# Patient Record
Sex: Female | Born: 1975 | Hispanic: No | Smoking: Current every day smoker
Health system: Southern US, Community
[De-identification: ages and names within clinical notes are randomized; demographics above are authoritative.]

## PROBLEM LIST (undated history)

## (undated) DIAGNOSIS — B1920 Unspecified viral hepatitis C without hepatic coma: Secondary | ICD-10-CM

## (undated) DIAGNOSIS — I38 Endocarditis, valve unspecified: Secondary | ICD-10-CM

## (undated) HISTORY — PX: TUBAL LIGATION: SHX77

---

## 1999-08-29 ENCOUNTER — Emergency Department (HOSPITAL_COMMUNITY): Admission: EM | Admit: 1999-08-29 | Discharge: 1999-08-29 | Payer: Self-pay | Admitting: Emergency Medicine

## 1999-09-03 ENCOUNTER — Emergency Department (HOSPITAL_COMMUNITY): Admission: EM | Admit: 1999-09-03 | Discharge: 1999-09-03 | Payer: Self-pay | Admitting: Emergency Medicine

## 2002-02-05 ENCOUNTER — Emergency Department (HOSPITAL_COMMUNITY): Admission: EM | Admit: 2002-02-05 | Discharge: 2002-02-05 | Payer: Self-pay | Admitting: Emergency Medicine

## 2002-02-05 ENCOUNTER — Encounter: Payer: Self-pay | Admitting: Emergency Medicine

## 2002-04-23 ENCOUNTER — Emergency Department (HOSPITAL_COMMUNITY): Admission: EM | Admit: 2002-04-23 | Discharge: 2002-04-23 | Payer: Self-pay | Admitting: Internal Medicine

## 2007-11-27 ENCOUNTER — Emergency Department: Payer: Self-pay | Admitting: Emergency Medicine

## 2008-10-08 ENCOUNTER — Emergency Department: Payer: Self-pay | Admitting: Emergency Medicine

## 2009-01-13 IMAGING — CT CT HEAD WITHOUT CONTRAST
2 series · 16 of 30 positions shown, 20 images · non-contrast
Comparison: None

REASON FOR EXAM: ha
COMMENTS:

PROCEDURE:     CT  - CT HEAD WITHOUT CONTRAST  - November 27, 2007 [DATE]
RESULT:     Noncontrast CT of the brain dated 11/27/2007.
INDICATION: Headache
TECHNIQUE: Multiple axial images from the foramen magnum to the vertex were
obtained without IV contrast.

[Series 2: without · axial · non-contrast · 0.40mm/px · z∈[-77,+43]mm · 13 of 30 slices shown, 17 images]
[im 3/30  brain]
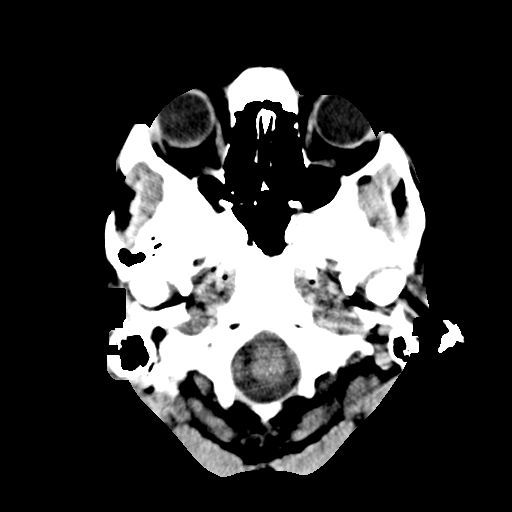
[im 3/30  bone]
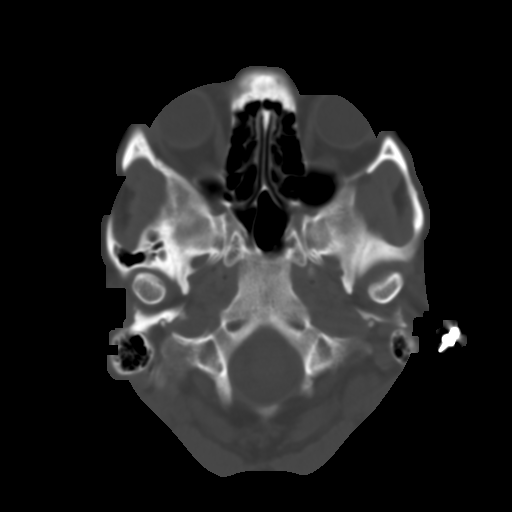
[im 5/30  brain]
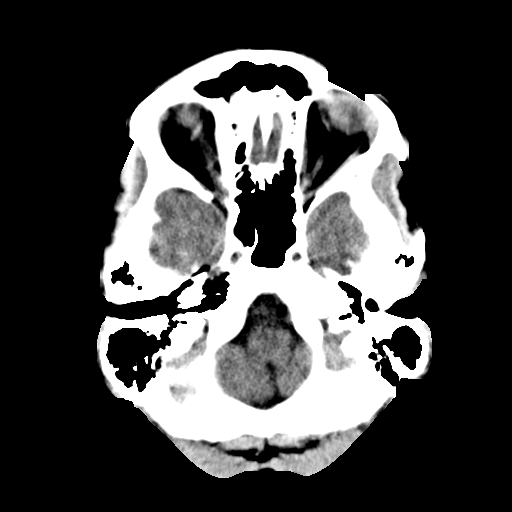
[im 7/30  brain]
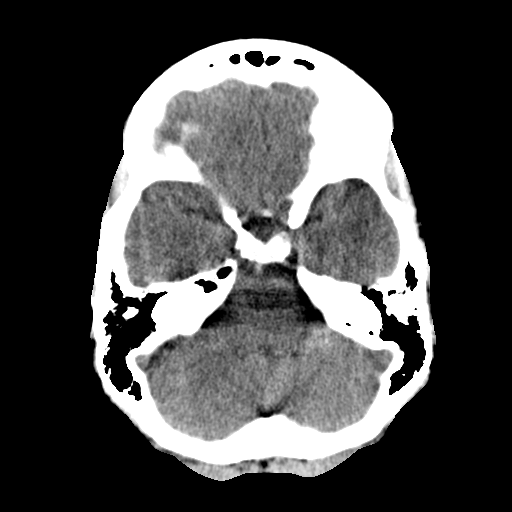
[im 9/30  brain]
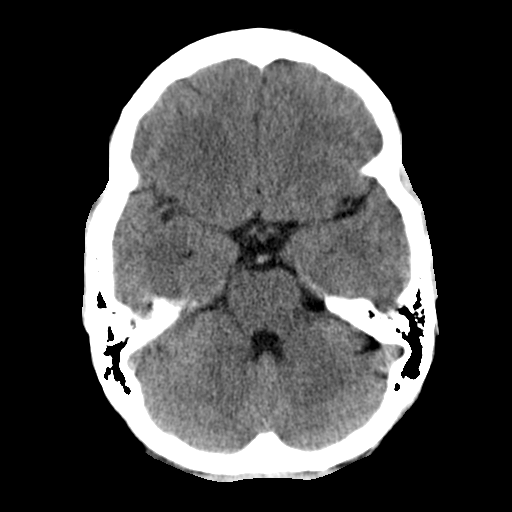
[im 11/30  brain]
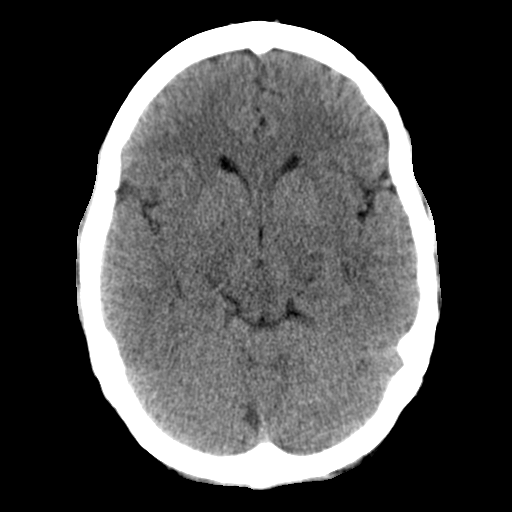
[im 11/30  bone]
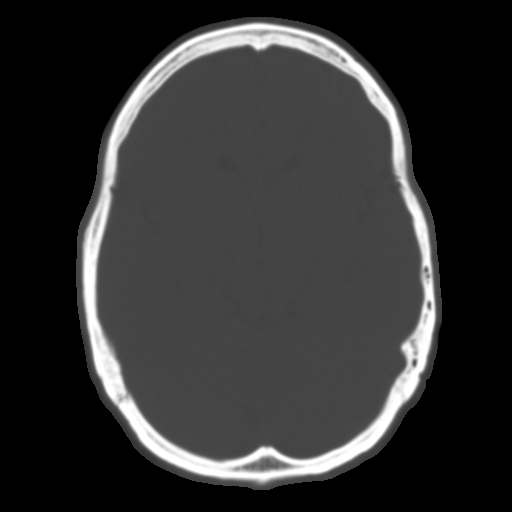
[im 13/30  brain]
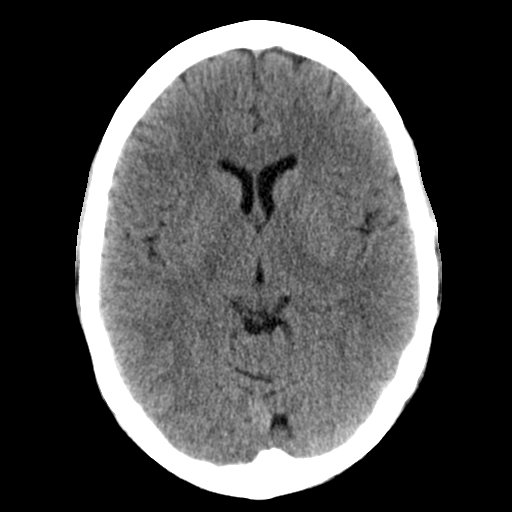
[im 15/30  brain]
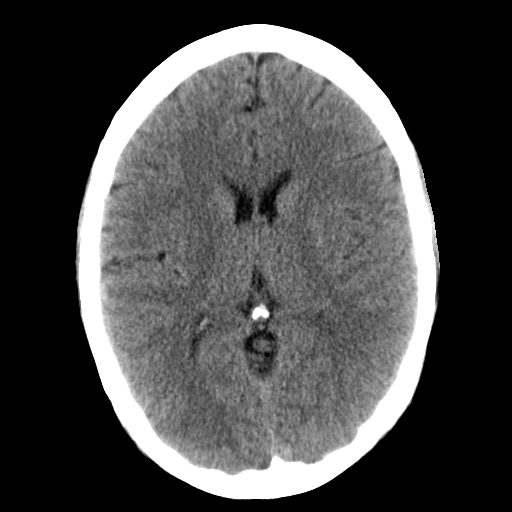
[im 17/30  brain]
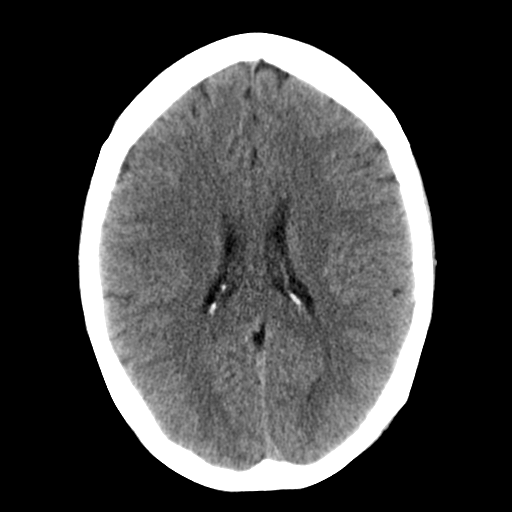
[im 19/30  brain]
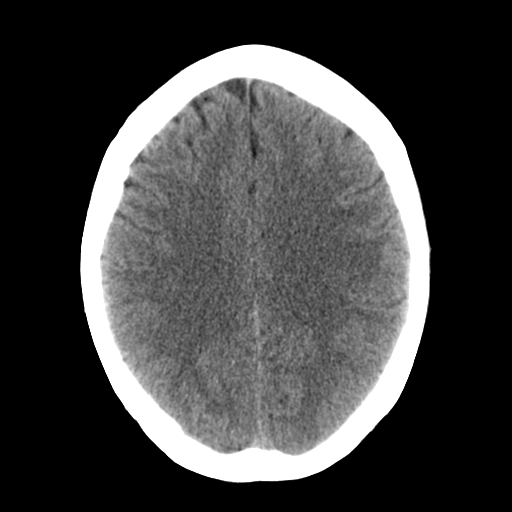
[im 19/30  bone]
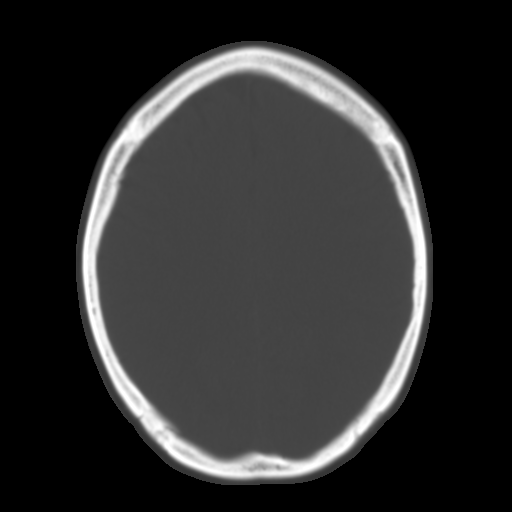
[im 21/30  brain]
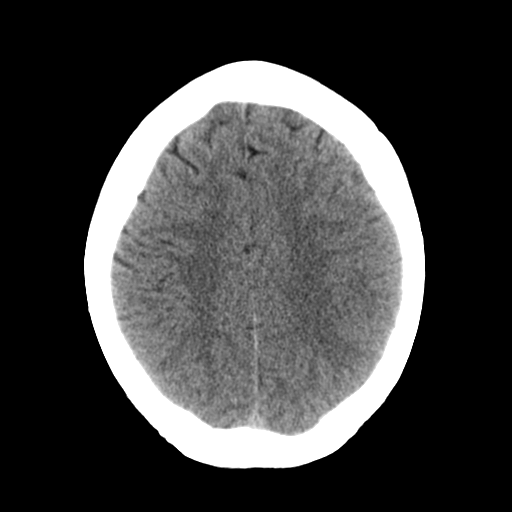
[im 23/30  brain]
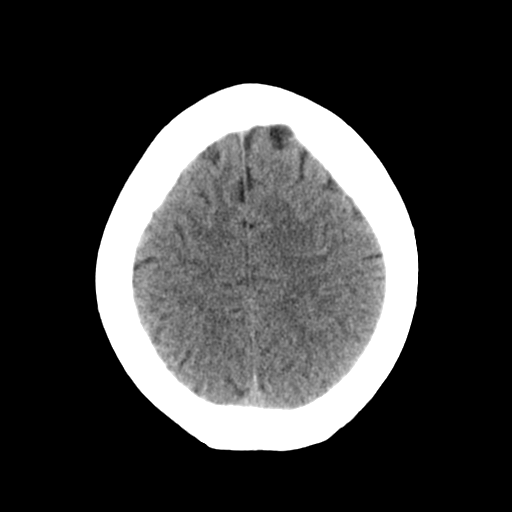
[im 25/30  brain]
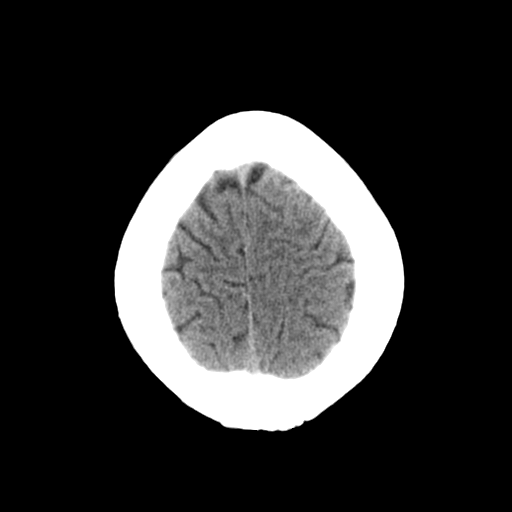
[im 27/30  brain]
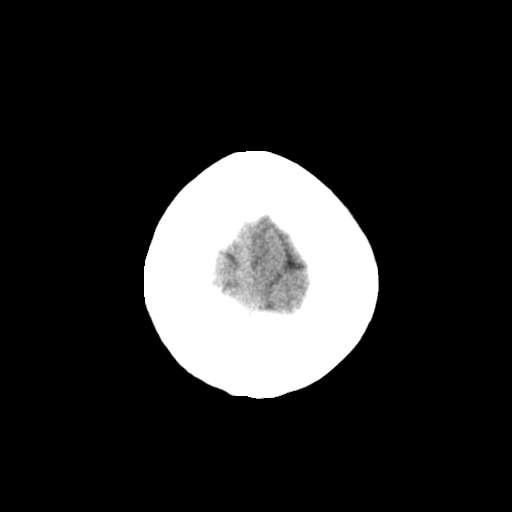
[im 27/30  bone]
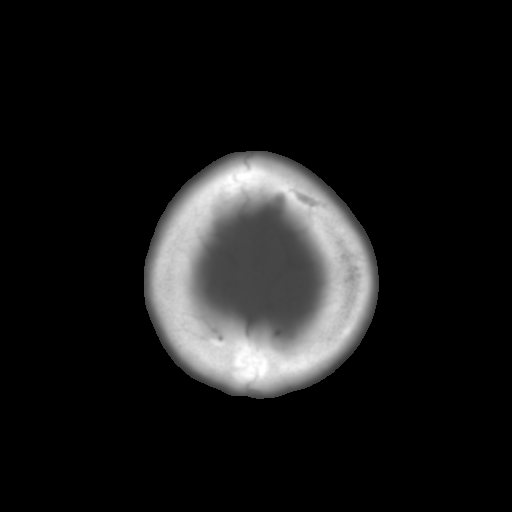

[Series 3: bone · axial · 0.40mm/px · z∈[-77,-37]mm · 3 of 30 slices shown]
[im 3/30  bone]
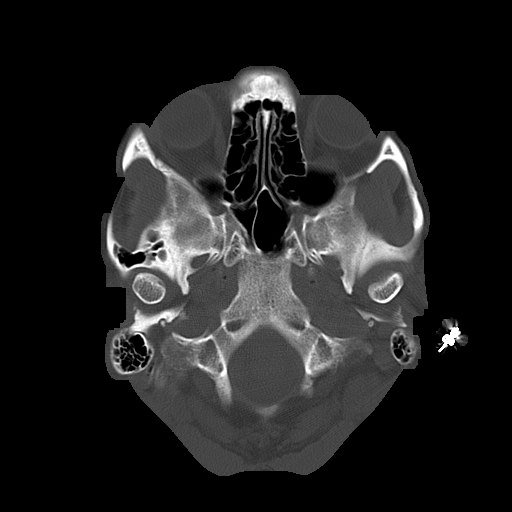
[im 7/30  bone]
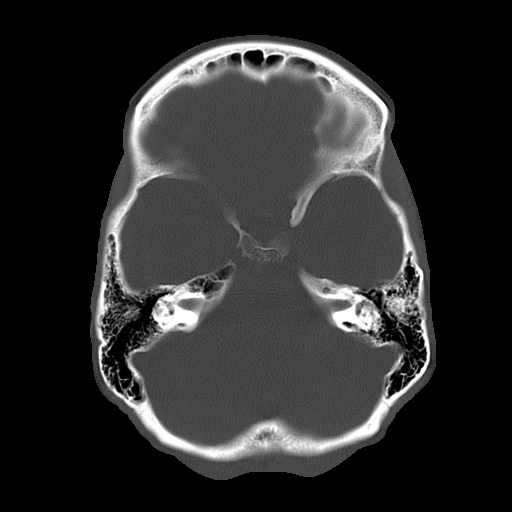
[im 11/30  bone]
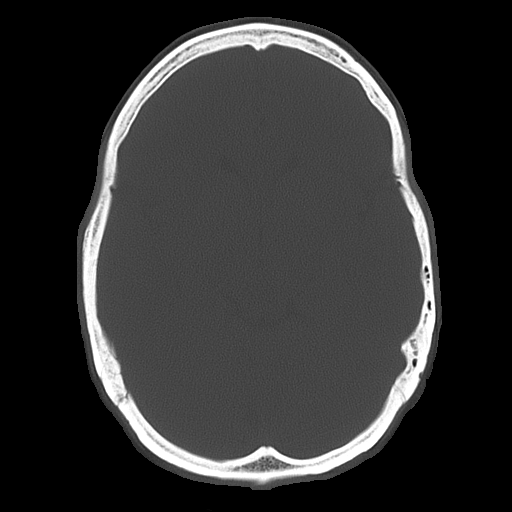

[16 of 30 positions shown; findings below may reference images not displayed]

FINDINGS: There is no evidence for mass effect, midline shift, or extra-axial fluid
collections.  There is no evidence for space-occupying lesion or
intracranial hemorrhage. There is no evidence for cortical-based area of
infarction.

Ventricles and sulci are appropriate for the patient's age. The basal
cisterns are patent.

Visualized portions of the orbits are unremarkable. The paranasal sinuses
and mastoid air cells are unremarkable.

The osseous structures are unremarkable.
IMPRESSION: No acute intracranial process.

## 2009-02-17 ENCOUNTER — Emergency Department: Payer: Self-pay | Admitting: Emergency Medicine

## 2009-09-24 ENCOUNTER — Other Ambulatory Visit: Admission: RE | Admit: 2009-09-24 | Discharge: 2009-09-24 | Payer: Self-pay | Admitting: Obstetrics and Gynecology

## 2009-09-24 ENCOUNTER — Ambulatory Visit: Payer: Self-pay | Admitting: Obstetrics and Gynecology

## 2009-09-24 LAB — CONVERTED CEMR LAB
Eosinophils Absolute: 0.3 10*3/uL (ref 0.0–0.7)
HCT: 36 % (ref 36.0–46.0)
Hemoglobin: 12.3 g/dL (ref 12.0–15.0)
Hepatitis B Surface Ag: NEGATIVE
Lymphs Abs: 3.1 10*3/uL (ref 0.7–4.0)
MCV: 88.2 fL (ref 78.0–100.0)
Monocytes Relative: 8 % (ref 3–12)
Neutrophils Relative %: 71 % (ref 43–77)
RBC: 4.08 M/uL (ref 3.87–5.11)
Rubella: 15 intl units/mL — ABNORMAL HIGH
WBC: 16.7 10*3/uL — ABNORMAL HIGH (ref 4.0–10.5)

## 2009-10-16 ENCOUNTER — Ambulatory Visit (HOSPITAL_COMMUNITY): Admission: RE | Admit: 2009-10-16 | Discharge: 2009-10-16 | Payer: Self-pay | Admitting: Obstetrics & Gynecology

## 2009-10-16 ENCOUNTER — Ambulatory Visit: Payer: Self-pay | Admitting: Obstetrics & Gynecology

## 2009-11-26 ENCOUNTER — Ambulatory Visit: Payer: Self-pay | Admitting: Obstetrics and Gynecology

## 2009-11-26 LAB — CONVERTED CEMR LAB
HCT: 36.2 % (ref 36.0–46.0)
MCV: 92.6 fL (ref 78.0–100.0)
Platelets: 274 10*3/uL (ref 150–400)
RDW: 15.1 % (ref 11.5–15.5)

## 2009-12-03 ENCOUNTER — Ambulatory Visit: Payer: Self-pay | Admitting: Obstetrics and Gynecology

## 2009-12-03 ENCOUNTER — Encounter: Payer: Self-pay | Admitting: Family Medicine

## 2009-12-27 ENCOUNTER — Ambulatory Visit: Payer: Self-pay | Admitting: Obstetrics & Gynecology

## 2009-12-28 ENCOUNTER — Encounter: Payer: Self-pay | Admitting: Obstetrics & Gynecology

## 2009-12-31 ENCOUNTER — Ambulatory Visit (HOSPITAL_COMMUNITY): Admission: RE | Admit: 2009-12-31 | Discharge: 2009-12-31 | Payer: Self-pay | Admitting: Family Medicine

## 2010-01-10 ENCOUNTER — Ambulatory Visit: Payer: Self-pay | Admitting: Obstetrics & Gynecology

## 2010-01-24 ENCOUNTER — Encounter: Payer: Self-pay | Admitting: Family Medicine

## 2010-01-24 ENCOUNTER — Ambulatory Visit: Payer: Self-pay | Admitting: Obstetrics & Gynecology

## 2010-01-24 LAB — CONVERTED CEMR LAB
Chlamydia, DNA Probe: NEGATIVE
GC Probe Amp, Genital: NEGATIVE

## 2010-01-25 ENCOUNTER — Encounter: Payer: Self-pay | Admitting: Family Medicine

## 2010-01-25 LAB — CONVERTED CEMR LAB: Yeast Wet Prep HPF POC: NONE SEEN

## 2010-01-31 ENCOUNTER — Ambulatory Visit: Payer: Self-pay | Admitting: Obstetrics & Gynecology

## 2010-01-31 ENCOUNTER — Inpatient Hospital Stay (HOSPITAL_COMMUNITY): Admission: AD | Admit: 2010-01-31 | Discharge: 2010-01-31 | Payer: Self-pay | Admitting: Obstetrics & Gynecology

## 2010-02-03 ENCOUNTER — Ambulatory Visit: Payer: Self-pay | Admitting: Family Medicine

## 2010-02-03 ENCOUNTER — Inpatient Hospital Stay (HOSPITAL_COMMUNITY): Admission: AD | Admit: 2010-02-03 | Discharge: 2010-02-04 | Payer: Self-pay | Admitting: Obstetrics & Gynecology

## 2010-02-07 ENCOUNTER — Ambulatory Visit: Payer: Self-pay | Admitting: Obstetrics & Gynecology

## 2010-02-09 ENCOUNTER — Inpatient Hospital Stay (HOSPITAL_COMMUNITY): Admission: AD | Admit: 2010-02-09 | Discharge: 2010-02-09 | Payer: Self-pay | Admitting: Obstetrics & Gynecology

## 2010-02-13 ENCOUNTER — Inpatient Hospital Stay (HOSPITAL_COMMUNITY): Admission: AD | Admit: 2010-02-13 | Discharge: 2010-02-13 | Payer: Self-pay | Admitting: Obstetrics and Gynecology

## 2010-02-13 ENCOUNTER — Ambulatory Visit: Payer: Self-pay | Admitting: Obstetrics & Gynecology

## 2010-02-14 ENCOUNTER — Inpatient Hospital Stay (HOSPITAL_COMMUNITY): Admission: AD | Admit: 2010-02-14 | Discharge: 2010-02-16 | Payer: Self-pay | Admitting: Obstetrics and Gynecology

## 2010-02-14 ENCOUNTER — Ambulatory Visit: Payer: Self-pay | Admitting: Obstetrics & Gynecology

## 2010-04-03 ENCOUNTER — Ambulatory Visit: Payer: Self-pay | Admitting: Obstetrics and Gynecology

## 2010-08-01 LAB — CBC
HCT: 38.2 % (ref 36.0–46.0)
MCHC: 34.3 g/dL (ref 30.0–36.0)
RDW: 13.8 % (ref 11.5–15.5)
WBC: 20.1 10*3/uL — ABNORMAL HIGH (ref 4.0–10.5)

## 2010-08-01 LAB — RPR: RPR Ser Ql: NONREACTIVE

## 2010-10-01 NOTE — Assessment & Plan Note (Signed)
Amber Gilmore, Amber Gilmore             ACCOUNT NO.:  [id number]   MEDICAL RECORD NO.:  [id number]          PATIENT TYPE:  POB   LOCATION:  [facility] at [facility]         FACILITY:  [facility]   PHYSICIAN:  Catalina Antigua, MD     DATE OF BIRTH:  14-Nov-1975   DATE OF SERVICE:  04/03/2010                                  CLINIC NOTE   This is a 35 year old, G4, P1-1-2-2, who is status post vaginal delivery  on February 14, 2010, who presents today for postpartum check.  The  patient is currently without any complaints.  Denies any abnormal  bleeding, discharge, or pelvic pain.  The patient has not resumed her  menses nor she has sexual activity yet.  The patient is using postpartum  bilateral tubal ligation for birth control.  The patient denies any  signs and symptoms of postpartum depression, and reports receiving ample  help with the baby.  The patient is currently formula feeding and the  infant thriving well.   PHYSICAL EXAMINATION:  VITAL SIGNS:  Her blood pressure is 128/77, pulse  of 78, weight of 141 pounds, and height of 5 feet 4 inches.  LUNGS:  Clear to auscultation bilaterally.  HEART:  Regular rate and rhythm.  BREASTS:  Nontender.  No engorgement.  No palpable masses or  lymphadenopathy.  No expressible nipple discharge.  ABDOMEN:  Soft, nontender, nondistended.  Her umbilical incision is  perfectly healed.  No erythema, induration, or discharge.  PELVIC:  She had normal-appearing vaginal mucosa and normal-appearing  cervix.  No abnormal bleeding or discharge.  She had a small uterus.  No  palpable adnexal masses or tenderness.   A 2-3 mm skin tag was visualized on the lateral aspect of the left labia  majora, that tag to be removed.  After injection of lidocaine and using  a scalpel, the tag was removed without difficulty.  States that this was  achieved by applying pressure and silver nitrate.  The patient tolerated  the procedure well.  The patient will be contacted with any  abnormal  results.  The patient is due to return in May for full physical exam.  In the meantime, the patient is medically cleared to resume all  activities of daily living without any restriction.           ______________________________  Catalina Antigua, MD     PC/MEDQ  D:  04/03/2010  T:  04/03/2010  Job:  [id number]

## 2010-12-03 IMAGING — US US OB COMP +14 WK
1 series · 14 of 28 positions shown · non-contrast
Comparison: none

OBSTETRICAL ULTRASOUND:
 This ultrasound exam was performed in the [HOSPITAL] Ultrasound Department.  The OB US report was generated in the AS system, and faxed to the ordering physician.  This report is also available in [HOSPITAL]?s [employer] and in [REDACTED] PACS.

[Series 1: us ob comp +14 wk · 0.08mm/px · 14 of 60 slices shown]
[im 3/60]
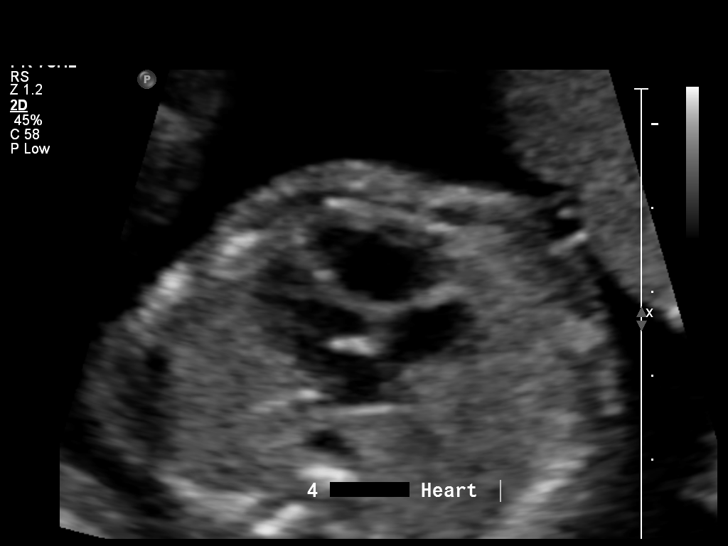
[im 7/60]
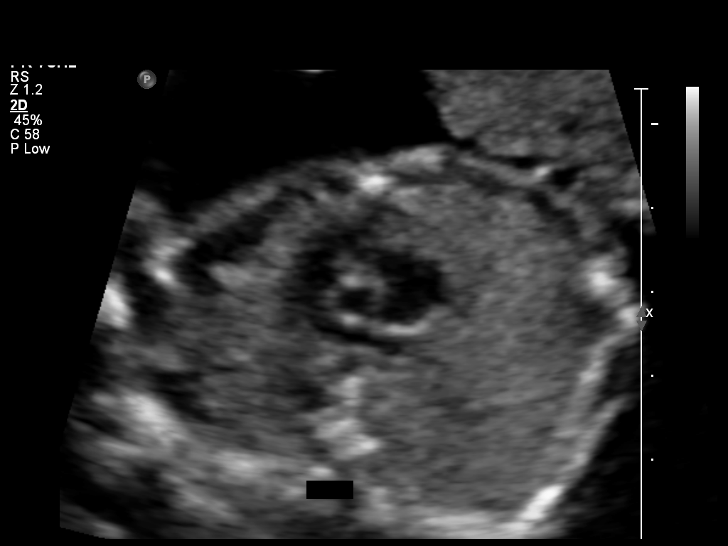
[im 11/60]
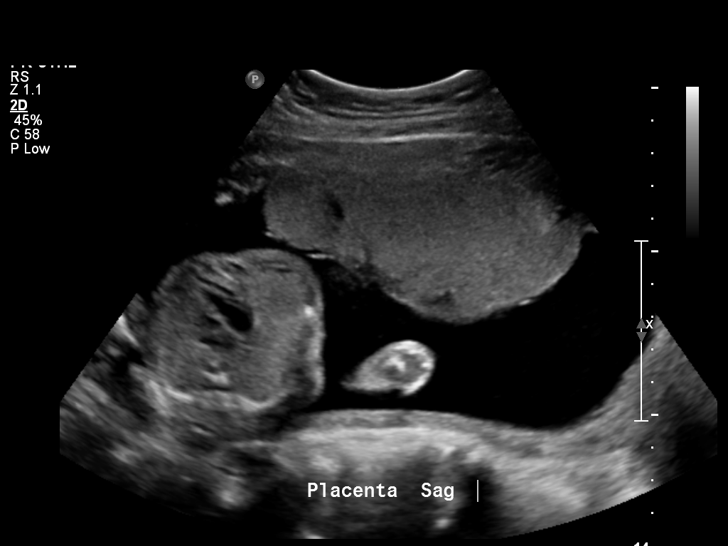
[im 16/60]
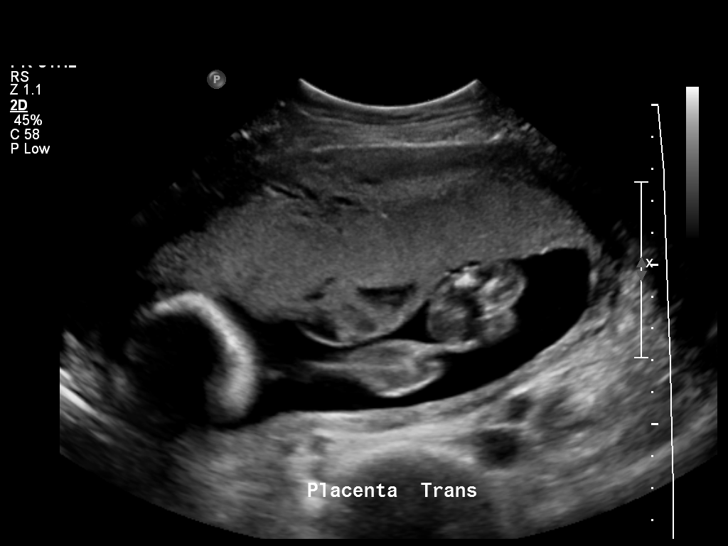
[im 20/60]
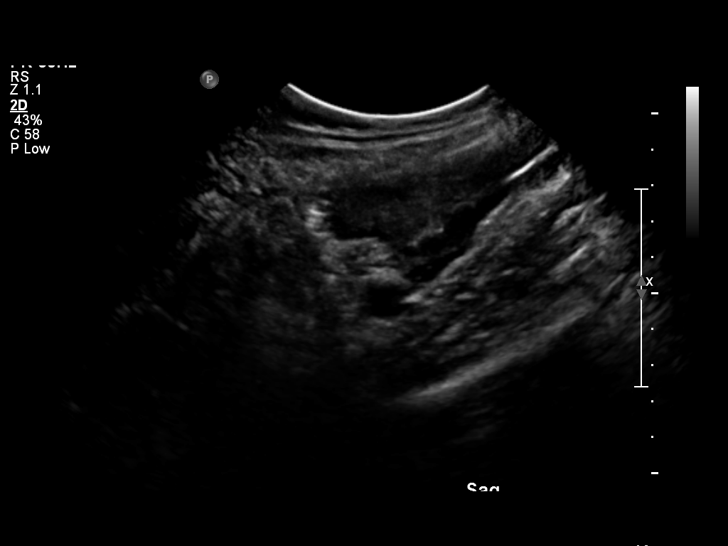
[im 25/60]
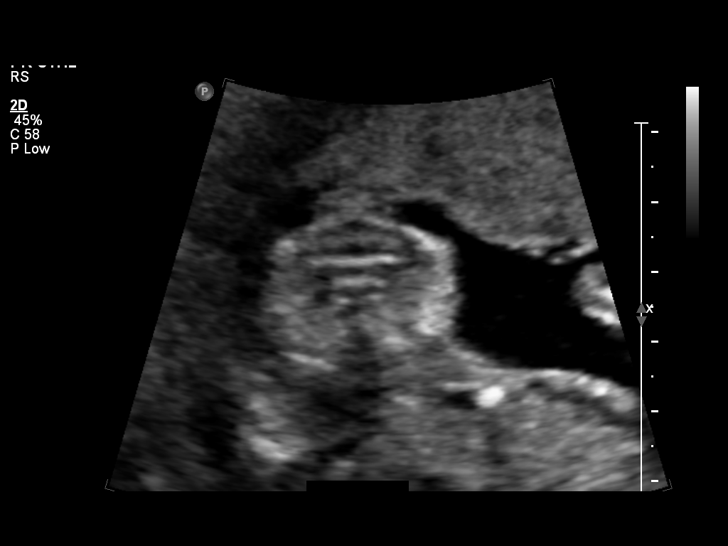
[im 29/60]
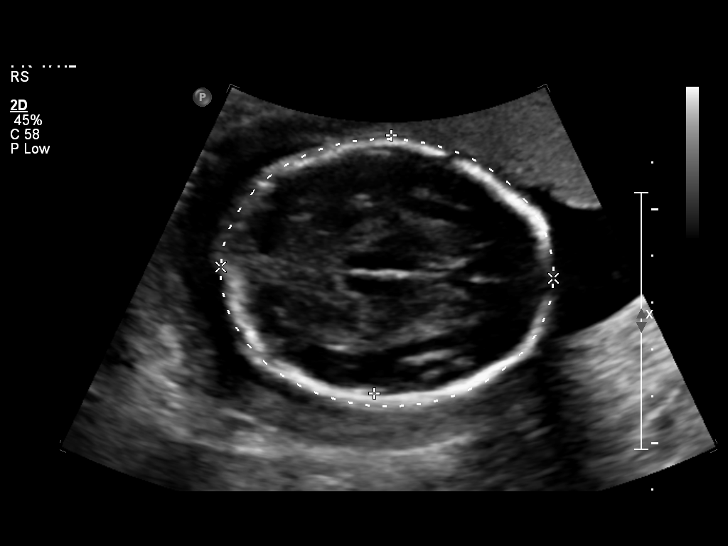
[im 33/60]
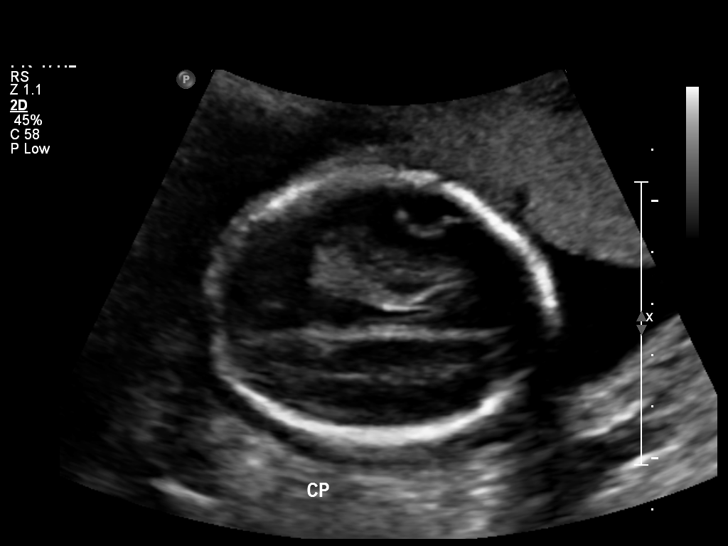
[im 38/60]
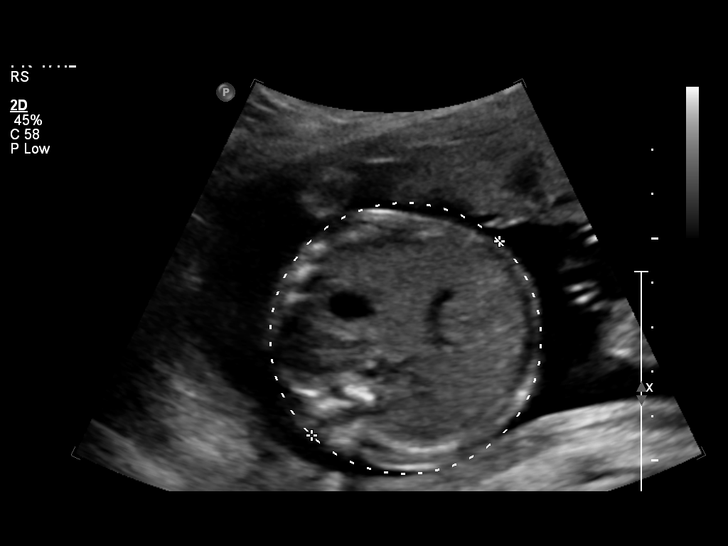
[im 42/60]
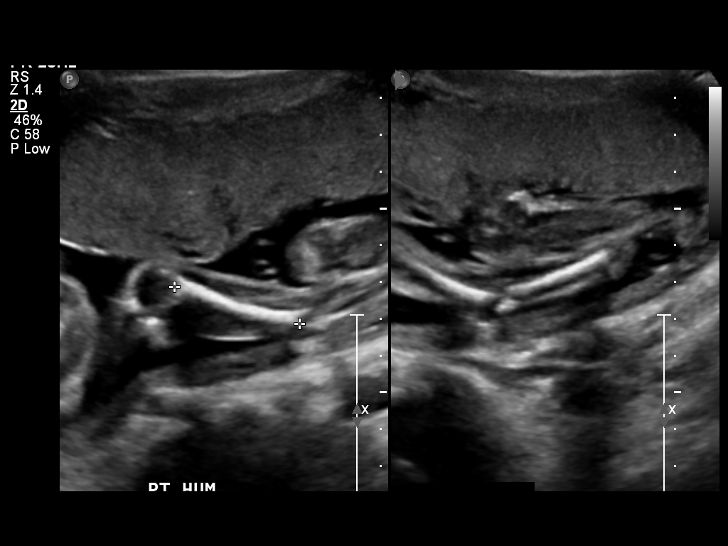
[im 46/60]
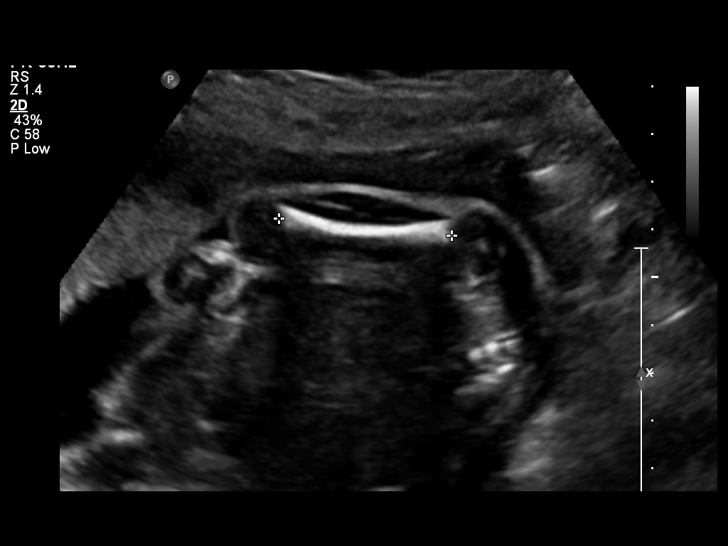
[im 51/60]
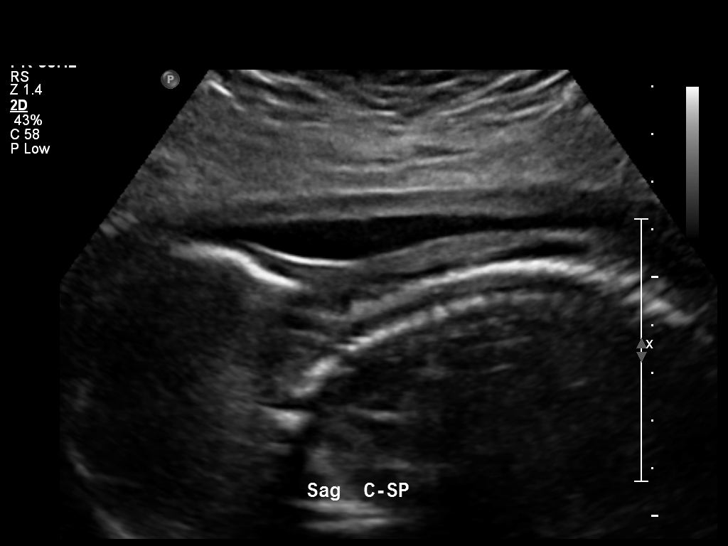
[im 55/60]
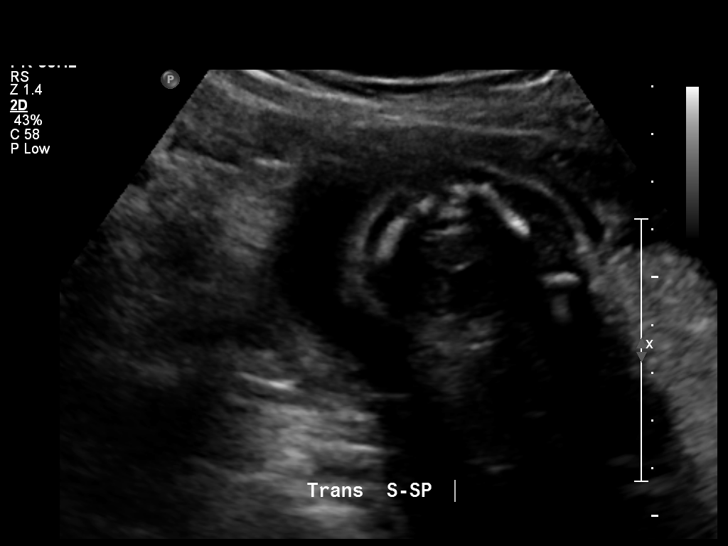
[im 60/60]
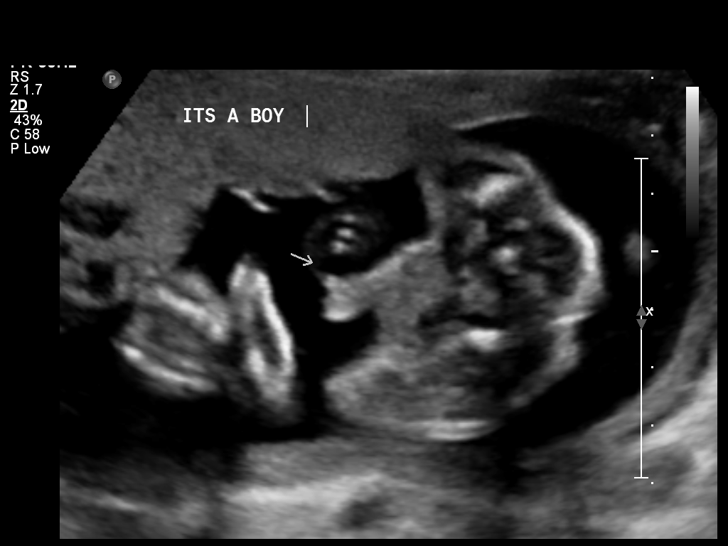

[14 of 28 positions shown; findings below may reference images not displayed]

IMPRESSION: See AS Obstetric US report.

## 2013-06-26 ENCOUNTER — Emergency Department: Payer: Self-pay | Admitting: Emergency Medicine

## 2014-07-01 ENCOUNTER — Emergency Department: Payer: Self-pay | Admitting: Emergency Medicine

## 2015-04-30 ENCOUNTER — Encounter: Payer: Self-pay | Admitting: *Deleted

## 2015-04-30 ENCOUNTER — Emergency Department
Admission: EM | Admit: 2015-04-30 | Discharge: 2015-04-30 | Disposition: A | Discharge status: Home / Self Care | Payer: Self-pay | Attending: Emergency Medicine | Admitting: Emergency Medicine

## 2015-04-30 DIAGNOSIS — Y998 Other external cause status: Secondary | ICD-10-CM | POA: Insufficient documentation

## 2015-04-30 DIAGNOSIS — F1910 Other psychoactive substance abuse, uncomplicated: Secondary | ICD-10-CM

## 2015-04-30 DIAGNOSIS — Z79899 Other long term (current) drug therapy: Secondary | ICD-10-CM | POA: Insufficient documentation

## 2015-04-30 DIAGNOSIS — T507X1A Poisoning by analeptics and opioid receptor antagonists, accidental (unintentional), initial encounter: Secondary | ICD-10-CM | POA: Insufficient documentation

## 2015-04-30 DIAGNOSIS — F1110 Opioid abuse, uncomplicated: Secondary | ICD-10-CM | POA: Insufficient documentation

## 2015-04-30 DIAGNOSIS — Y92002 Bathroom of unspecified non-institutional (private) residence single-family (private) house as the place of occurrence of the external cause: Secondary | ICD-10-CM | POA: Insufficient documentation

## 2015-04-30 DIAGNOSIS — T40601A Poisoning by unspecified narcotics, accidental (unintentional), initial encounter: Secondary | ICD-10-CM

## 2015-04-30 DIAGNOSIS — Y9389 Activity, other specified: Secondary | ICD-10-CM | POA: Insufficient documentation

## 2015-04-30 DIAGNOSIS — F17200 Nicotine dependence, unspecified, uncomplicated: Secondary | ICD-10-CM | POA: Insufficient documentation

## 2015-04-30 NOTE — Discharge Instructions (Signed)
You have been clean for over 3 weeks. Continue with your sobriety. Seek support where you can, including your support meetings. [facility] a counselor or [facility]. Do not use any substances. Return to the emergency department if you have any further urgent concerns  Narcotic Overdose A narcotic overdose is the misuse or overuse of a narcotic drug. A narcotic overdose can make you pass out and stop breathing. If you are not treated right away, this can cause permanent brain damage or stop your heart. Medicine may be given to reverse the effects of an overdose. If so, this medicine may bring on withdrawal symptoms. The symptoms may be abdominal cramps, throwing up (vomiting), sweating, chills, and nervousness. Injecting narcotics can cause more problems than just an overdose. AIDS, hepatitis, and other very serious infections are transmitted by sharing needles and syringes. If you decide to quit using, there are medicines which can help you through the withdrawal period. Trying to quit all at once on your own can be uncomfortable, but not life-threatening. Call your caregiver, Narcotics Anonymous, or any drug and alcohol treatment program for further help.    This information is not intended to replace advice given to you by your health care provider. Make sure you discuss any questions you have with your health care provider.   Document Released: 06/12/2004 Document Revised: 05/26/2014 Document Reviewed: 10/25/2014 Elsevier Interactive Patient Education [employer]2016 [employer]vier Inc.

## 2015-04-30 NOTE — ED Notes (Signed)
Got out of rehab 3 weeks ago, used heroin today became unresponsive, person with her took her to the firestation and received narcan with good results, pt became more alert

## 2015-04-30 NOTE — ED Provider Notes (Signed)
[facility] Emergency Department Provider Note  ____________________________________________  Time seen: 1807  I have reviewed the triage vital signs and the nursing notes.  History by:  Patient  HISTORY  Chief Complaint Drug Problem  heroin overdose    HPI Amber Gilmore is a 39 y.o. female who reports just went 26 days clean, having spent approximate 3 weeks and rehabilitation. She reports that she obtain heroin earlier today. A friend was coming over to go out for dinner. She reports she heard the car pulling up and decided to use to heroin. She went to the bathroom to inject. She remembers coming out of the bathroom and her friend realizing that she had just used heroin started arguing with her. She remembers walking to his car but nothing further. He drove her to a fire station where EMS became involved and she received 1 mg of Narcan and became alert again.   The patient remains alert in the emergency department. She reports she also uses some crystal meth this morning. She reports she has been going to her support meetings but has missed a few recently. She denies any thoughts of self-harm. She appears to be remorseful and tearful about what occurred.  She lives at home with her mother.   History reviewed. No pertinent past medical history.  There are no active problems to display for this patient.   History reviewed. No pertinent past surgical history.  Current Outpatient Rx  Name  Route  Sig  Dispense  Refill  . citalopram (CELEXA) 10 MG tablet   Oral   Take 10 mg by mouth daily.           Allergies Review of patient's allergies indicates no known allergies.  No family history on file.  Social History Social History  Substance Use Topics  . Smoking status: Current Every Day Smoker  . Smokeless tobacco: None  . Alcohol Use: No    Review of Systems  Constitutional: Negative for fever/chills. ENT: Negative for  congestion. Cardiovascular: Negative for chest pain. Respiratory: Negative for cough. Gastrointestinal: Negative for abdominal pain, vomiting and diarrhea. Genitourinary: Negative for dysuria. Musculoskeletal: No back pain. Skin: Multiple small scabbed over areas on her forearms and hands and a few on her face. She reports she picks at her skin. This is a long-term problem.. Neurological: Negative for headache or focal weakness Psychiatric: Ongoing substance abuse problem. Patient denies suicidal ideation. See history of present illness  10-point ROS otherwise negative.  ____________________________________________   PHYSICAL EXAM:  VITAL SIGNS: ED Triage Vitals  Enc Vitals Group     BP 04/30/15 1749 86/46 mmHg     Pulse Rate 04/30/15 1749 81     Resp 04/30/15 1749 20     Temp 04/30/15 1749 97.7 F (36.5 C)     Temp src --      SpO2 04/30/15 1749 95 %     Weight 04/30/15 1749 140 lb (63.504 kg)     Height 04/30/15 1749  (1.676 m)     Head Cir --      Peak Flow --      Pain Score --      Pain Loc --      Pain Edu? --      Excl. in GC? --     Constitutional:  Alert and oriented. Communicative with intact thought process. ENT   Head: Normocephalic and atraumatic.   Nose: No congestion/rhinnorhea.       Mouth:  No erythema, no swelling   Cardiovascular: Normal rate, regular rhythm, no murmur noted Respiratory:  Normal respiratory effort, no tachypnea.    Breath sounds are clear and equal bilaterally.  Gastrointestinal: Soft, no distention. Nontender Back: No muscle spasm, no tenderness, no CVA tenderness. Musculoskeletal: No deformity noted. Nontender with normal range of motion in all extremities.  No noted edema. Neurologic:  Communicative. Normal appearing spontaneous movement in all 4 extremities. No gross focal neurologic deficits are appreciated.  Skin:  Skin is warm, dry. Patient has multiple scabbed over, excoriated, areas on her hands, not as many on  her forearms, and a few on her face. She reports she picks at her skin. I can see her doing this while interviewing her. Psychiatric: Alert and oriented 3. Intact thought process. Remorseful and somewhat tearful, but appropriate and communicative. Denies any suicidal ideation. Appears to be forthright about her substance abuse issues and her challenges.  ____________________________________________    LABS (pertinent positives/negatives)    ____________________________________________   EKG  ED ECG REPORT I, Jasiah Elsen W, the attending physician, personally viewed and interpreted this ECG.   Date: 04/30/2015  EKG Time: 1746  Rate: 85  Rhythm:  Normal sinus rhythm  Axis: Normal  Intervals: Normal  ST&T Change: None noted   ____________________________________________    RADIOLOGY    ____________________________________________   PROCEDURES    ____________________________________________   INITIAL IMPRESSION / ASSESSMENT AND PLAN / ED COURSE  Pertinent labs & imaging results that were available during my care of the patient were reviewed by me and considered in my medical decision making (see chart for details).  39 year old female with a long-term substance abuse problem, recently out of rehabilitation, with a relapse today with an overdose of heroin. She became responsive and alert with 1 mg of Narcan. She remains so here in the emergency department. She denies suicidal ideation. She declines an offer to seek further residential treatment for her problems. She reports she knows what she needs to do, including going to her support meetings. She seems to be remorseful about the event and motivated to stay clean. I do not see any indication for involuntary commitment.  The patient agrees to stay in the emergency department to allow observation and be sure that she does not become unresponsive again when the Narcan wears off area my suspicion for this is low. We will  observe her for 2 hours and discharge her home, unless she has a change of heart and would like to pursue further drug treatment at this time.  ----------------------------------------- 7:45 PM on 04/30/2015 -----------------------------------------  Patient appears alert and communicative. She has not had any resumed narcosis or somnolence. She again reports that she feels safe and declines an offer for any further help with her substance abuse problem. She reports she knows what to do and will follow-up with her support groups. She lives with her mother. We will discharge her at this time.  ____________________________________________   FINAL CLINICAL IMPRESSION(S) / ED DIAGNOSES  Final diagnoses:  Substance abuse  Narcotic overdose, accidental or unintentional, initial encounter      Darien Ramusavid W Dean Wonder, MD 04/30/15 1946

## 2015-07-14 ENCOUNTER — Encounter: Payer: Self-pay | Admitting: Emergency Medicine

## 2015-07-14 ENCOUNTER — Emergency Department
Admission: EM | Admit: 2015-07-14 | Discharge: 2015-07-14 | Disposition: A | Discharge status: Home / Self Care | Payer: Self-pay | Attending: Emergency Medicine | Admitting: Emergency Medicine

## 2015-07-14 DIAGNOSIS — J020 Streptococcal pharyngitis: Secondary | ICD-10-CM | POA: Insufficient documentation

## 2015-07-14 DIAGNOSIS — F17200 Nicotine dependence, unspecified, uncomplicated: Secondary | ICD-10-CM | POA: Insufficient documentation

## 2015-07-14 DIAGNOSIS — Z79899 Other long term (current) drug therapy: Secondary | ICD-10-CM | POA: Insufficient documentation

## 2015-07-14 MED ORDER — AMOXICILLIN 500 MG PO TABS: 500.0000 mg | ORAL_TABLET | Freq: Three times a day (TID) | ORAL | Status: AC

## 2015-07-14 MED ORDER — LIDOCAINE VISCOUS 2 % MT SOLN: 20.0000 mL | OROMUCOSAL | Status: AC | PRN

## 2015-07-14 NOTE — Discharge Instructions (Signed)

## 2015-07-14 NOTE — ED Notes (Signed)
Pt to ed with c/o cough, sore throat, fever, body aches, since last night.  Pt in no acute resp distress, no cough noted during triage.  Pt also c/o headache. Skin warm and dry.

## 2015-07-14 NOTE — ED Notes (Signed)
C/o sore throat, no resp distress

## 2015-07-14 NOTE — ED Provider Notes (Signed)
[facility] Emergency Department Provider Note  ____________________________________________  Time seen: Approximately 11:40 AM  I have reviewed the triage vital signs and the nursing notes.   HISTORY  Chief Complaint Sore Throat   HPI Amber Gilmore is a 40 y.o. female who presents to the emergency department for evaluation of sore throat and headache. Headache started on Friday, throat became sore yesterday and she has noticed white patches on her tonsils. She has been taking ibuprofen without relief.  History reviewed. No pertinent past medical history.  There are no active problems to display for this patient.   History reviewed. No pertinent past surgical history.  Current Outpatient Rx  Name  Route  Sig  Dispense  Refill  . amoxicillin (AMOXIL) 500 MG tablet   Oral   Take 1 tablet (500 mg total) by mouth 3 (three) times daily.   30 tablet   0   . citalopram (CELEXA) 10 MG tablet   Oral   Take 10 mg by mouth daily.         Marland Kitchen lidocaine (XYLOCAINE) 2 % solution   Mouth/Throat   Use as directed 20 mLs in the mouth or throat as needed (as needed for sore throat).   100 mL   0     Allergies Review of patient's allergies indicates no known allergies.  History reviewed. No pertinent family history.  Social History Social History  Substance Use Topics  . Smoking status: Current Every Day Smoker  . Smokeless tobacco: None  . Alcohol Use: No    Review of Systems Constitutional: Positive for fever. ENT: Positive for sore throat; negative for difficulty swallowing. Respiratory: Denies shortness of breath. Gastrointestinal: No abdominal pain.  No nausea, no vomiting.  No diarrhea.  Musculoskeletal: Positive for generalized body aches. Skin: Negative for rash. Neurological: Positive for headaches, negative for focal weakness or numbness.  ____________________________________________   PHYSICAL EXAM:  VITAL SIGNS: ED Triage  Vitals  Enc Vitals Group     BP 07/14/15 1124 112/86 mmHg     Pulse Rate 07/14/15 1124 98     Resp 07/14/15 1124 20     Temp 07/14/15 1124 99 F (37.2 C)     Temp Source 07/14/15 1124 Oral     SpO2 07/14/15 1124 98 %     Weight 07/14/15 1124 135 lb (61.236 kg)     Height 07/14/15 1124  (1.676 m)     Head Cir --      Peak Flow --      Pain Score 07/14/15 1121 8     Pain Loc --      Pain Edu? --      Excl. in GC? --     Constitutional: Alert and oriented. Well appearing and in no acute distress. Eyes: Conjunctivae are normal. PERRL. EOMI. Head: Atraumatic. Nose: No congestion/rhinnorhea. Mouth/Throat: Mucous membranes are moist.  Oropharynx erythematous, with tonsillar edema and  exudate. Uvula midline. Upper airway patent without edema. Neck: No stridor.  Lymphatic: Lymphadenopathy: absent  Cardiovascular: Normal rate, regular rhythm. Good peripheral circulation. Respiratory: Normal respiratory effort. Lungs CTAB. Gastrointestinal: Soft and nontender. Musculoskeletal: No lower extremity tenderness nor edema.   Neurologic:  Normal speech and language. No gross focal neurologic deficits are appreciated. Speech is normal. No gait instability. Skin:  Skin is warm, dry and intact. No rash noted Psychiatric: Mood and affect are normal. Speech and behavior are normal.  ____________________________________________   LABS (all labs ordered are listed, but  only abnormal results are displayed)  Labs Reviewed - No data to display ____________________________________________  EKG   ____________________________________________  RADIOLOGY   ____________________________________________   PROCEDURES  Procedure(s) performed: None  Critical Care performed: No  ____________________________________________   INITIAL IMPRESSION / ASSESSMENT AND PLAN / ED COURSE  Pertinent labs & imaging results that were available during my care of the patient were reviewed by me and  considered in my medical decision making (see chart for details).  Diagnosis based on clinical findings. Will treat with amoxicillin x 10 days and give viscous lidocaine for pain. She was advised to take ibuprofen for headache and/or fever. She is to follow up with the PCP of her choice or return to the ER for symptoms that change or worsen if unable to schedule an appointment. ____________________________________________   FINAL CLINICAL IMPRESSION(S) / ED DIAGNOSES  Final diagnoses:  Strep pharyngitis      Chinita Pester, FNP 07/14/15 1647  Governor Rooks, MD 07/15/15 [phone]

## 2015-07-17 ENCOUNTER — Encounter: Payer: Self-pay | Admitting: Medical Oncology

## 2015-07-17 ENCOUNTER — Emergency Department
Admission: EM | Admit: 2015-07-17 | Discharge: 2015-07-17 | Disposition: A | Discharge status: Home / Self Care | Payer: Self-pay | Attending: Emergency Medicine | Admitting: Emergency Medicine

## 2015-07-17 DIAGNOSIS — F17200 Nicotine dependence, unspecified, uncomplicated: Secondary | ICD-10-CM | POA: Insufficient documentation

## 2015-07-17 DIAGNOSIS — Z79899 Other long term (current) drug therapy: Secondary | ICD-10-CM | POA: Insufficient documentation

## 2015-07-17 DIAGNOSIS — J020 Streptococcal pharyngitis: Secondary | ICD-10-CM | POA: Insufficient documentation

## 2015-07-17 DIAGNOSIS — Z792 Long term (current) use of antibiotics: Secondary | ICD-10-CM | POA: Insufficient documentation

## 2015-07-17 MED ORDER — MAGIC MOUTHWASH W/LIDOCAINE: 5.0000 mL | Freq: Four times a day (QID) | ORAL | Status: AC

## 2015-07-17 MED ORDER — CLINDAMYCIN HCL 300 MG PO CAPS: 300.0000 mg | ORAL_CAPSULE | Freq: Four times a day (QID) | ORAL | Status: AC

## 2015-07-17 NOTE — ED Provider Notes (Signed)
[facility] Emergency Department Provider Note  ____________________________________________  Time seen: Approximately 1:38 PM  I have reviewed the triage vital signs and the nursing notes.   HISTORY  Chief Complaint Sore Throat and Fever    HPI Amber Gilmore is a 40 y.o. female who presents emergency department for continued complaint of sore throat. Patient was seen on 07/14/2015 in this department and diagnosed with strep and placed on antibiotics and viscous lidocaine. Patient states that the sore throat has increased since original diagnosis. Patient states that she has been taking antibiotics as prescribed. Patient does smoke and has been continuing to smoke while on antibiotics.Patient denies any difficulty breathing or swallowing, fevers or chills, chest pain, shortness breath, nausea vomiting, abdominal pain.   History reviewed. No pertinent past medical history.  There are no active problems to display for this patient.   History reviewed. No pertinent past surgical history.  Current Outpatient Rx  Name  Route  Sig  Dispense  Refill  . amoxicillin (AMOXIL) 500 MG tablet   Oral   Take 1 tablet (500 mg total) by mouth 3 (three) times daily.   30 tablet   0   . citalopram (CELEXA) 10 MG tablet   Oral   Take 10 mg by mouth daily.         . clindamycin (CLEOCIN) 300 MG capsule   Oral   Take 1 capsule (300 mg total) by mouth 4 (four) times daily.   28 capsule   0   . lidocaine (XYLOCAINE) 2 % solution   Mouth/Throat   Use as directed 20 mLs in the mouth or throat as needed (as needed for sore throat).   100 mL   0   . magic mouthwash w/lidocaine SOLN   Oral   Take 5 mLs by mouth 4 (four) times daily.   240 mL   0     Dispense in a 1/1/1/1 ratio. Use lidocaine, diphen ...     Allergies Review of patient's allergies indicates no known allergies.  No family history on file.  Social History Social History  Substance Use  Topics  . Smoking status: Current Every Day Smoker  . Smokeless tobacco: None  . Alcohol Use: No     Review of Systems  Constitutional: No fever/chills ENT: Positive for sore throat. Denies nasal congestion. Denies ear pain. Cardiovascular: no chest pain. Respiratory: no cough. No SOB. Gastrointestinal: No abdominal pain.  No nausea, no vomiting.   Skin: Negative for rash. Neurological: Negative for headaches, focal weakness or numbness. 10-point ROS otherwise negative.  ____________________________________________   PHYSICAL EXAM:  VITAL SIGNS: ED Triage Vitals  Enc Vitals Group     BP 07/17/15 1158 113/86 mmHg     Pulse Rate 07/17/15 1158 87     Resp 07/17/15 1158 18     Temp 07/17/15 1158 98.4 F (36.9 C)     Temp Source 07/17/15 1158 Oral     SpO2 07/17/15 1158 99 %     Weight 07/17/15 1158 135 lb (61.236 kg)     Height --      Head Cir --      Peak Flow --      Pain Score 07/17/15 1159 7     Pain Loc --      Pain Edu? --      Excl. in GC? --      Constitutional: Alert and oriented. Well appearing and in no acute distress. Eyes: Conjunctivae are  normal. PERRL. EOMI. Head: Atraumatic. ENT:      Ears:       Nose: No congestion/rhinnorhea.      Mouth/Throat: Mucous membranes are moist. Oropharynx is very erythematous but nonedematous. Uvula is midline. Tonsils are very erythematous and edematous. Exudates are present. Neck: No stridor.   Hematological/Lymphatic/Immunilogical: Diffuse, mobile, tender anterior cervical lymphadenopathy. Cardiovascular: Normal rate, regular rhythm. Normal S1 and S2.  Good peripheral circulation. Respiratory: Normal respiratory effort without tachypnea or retractions. Lungs CTAB. Neurologic:  Normal speech and language. No gross focal neurologic deficits are appreciated.  Skin:  Skin is warm, dry and intact. No rash noted. Psychiatric: Mood and affect are normal. Speech and behavior are normal. Patient exhibits appropriate insight  and judgement.   ____________________________________________   LABS (all labs ordered are listed, but only abnormal results are displayed)  Labs Reviewed - No data to display ____________________________________________  EKG   ____________________________________________  RADIOLOGY   No results found.  ____________________________________________    PROCEDURES  Procedure(s) performed:       Medications - No data to display   ____________________________________________   INITIAL IMPRESSION / ASSESSMENT AND PLAN / ED COURSE  Pertinent labs & imaging results that were available during my care of the patient were reviewed by me and considered in my medical decision making (see chart for details).  Patient's diagnosis is consistent with strep throat. Patient has been on appropriate antibiotics and treatment for same. Patient does report an increase in her symptoms. At this time it is unsure whether this is a true treatment failure or whether symptoms are exacerbated by continued smoking. Patient is instructed to continue use of amoxicillin for at least another 24-36 hours. If patient continues to have a worsening of her symptoms she is to start the new antibiotic, clindamycin, that is prescribed today. Patient is highly encouraged to stop smoking. Patient will follow-up with ENT provider should symptoms persist after antibiotic switch if necessary. She verbalizes understanding of the diagnosis in this treatment plan and verbalizes compliance with same. Patient is given strict ED precautions to return for any sudden worsening or increase of her symptoms..     ____________________________________________  FINAL CLINICAL IMPRESSION(S) / ED DIAGNOSES  Final diagnoses:  Strep throat      NEW MEDICATIONS STARTED DURING THIS VISIT:  New Prescriptions   CLINDAMYCIN (CLEOCIN) 300 MG CAPSULE    Take 1 capsule (300 mg total) by mouth 4 (four) times daily.   MAGIC  MOUTHWASH W/LIDOCAINE SOLN    Take 5 mLs by mouth 4 (four) times daily.        Delorise Royals Teren Franckowiak, PA-C 07/17/15 1403  Myrna Blazer, MD 07/17/15 [phone]

## 2015-07-17 NOTE — Discharge Instructions (Signed)

## 2015-07-17 NOTE — ED Notes (Signed)
Pt reports she was seen here on 2/25 and given abx for strep, pt reports that she has continued to have sore throat, fever and body aches.

## 2015-07-18 ENCOUNTER — Encounter: Payer: Self-pay | Admitting: Emergency Medicine

## 2015-07-18 ENCOUNTER — Emergency Department
Admission: EM | Admit: 2015-07-18 | Discharge: 2015-07-19 | Disposition: A | Discharge status: Home / Self Care | Payer: Self-pay | Attending: Emergency Medicine | Admitting: Emergency Medicine

## 2015-07-18 DIAGNOSIS — F1310 Sedative, hypnotic or anxiolytic abuse, uncomplicated: Secondary | ICD-10-CM | POA: Insufficient documentation

## 2015-07-18 DIAGNOSIS — Y998 Other external cause status: Secondary | ICD-10-CM | POA: Insufficient documentation

## 2015-07-18 DIAGNOSIS — F17200 Nicotine dependence, unspecified, uncomplicated: Secondary | ICD-10-CM | POA: Insufficient documentation

## 2015-07-18 DIAGNOSIS — F32A Depression, unspecified: Secondary | ICD-10-CM

## 2015-07-18 DIAGNOSIS — F1410 Cocaine abuse, uncomplicated: Secondary | ICD-10-CM | POA: Insufficient documentation

## 2015-07-18 DIAGNOSIS — Z79899 Other long term (current) drug therapy: Secondary | ICD-10-CM | POA: Insufficient documentation

## 2015-07-18 DIAGNOSIS — Z792 Long term (current) use of antibiotics: Secondary | ICD-10-CM | POA: Insufficient documentation

## 2015-07-18 DIAGNOSIS — Z3202 Encounter for pregnancy test, result negative: Secondary | ICD-10-CM | POA: Insufficient documentation

## 2015-07-18 DIAGNOSIS — T401X1A Poisoning by heroin, accidental (unintentional), initial encounter: Secondary | ICD-10-CM | POA: Insufficient documentation

## 2015-07-18 DIAGNOSIS — Y9389 Activity, other specified: Secondary | ICD-10-CM | POA: Insufficient documentation

## 2015-07-18 DIAGNOSIS — Y9289 Other specified places as the place of occurrence of the external cause: Secondary | ICD-10-CM | POA: Insufficient documentation

## 2015-07-18 DIAGNOSIS — F329 Major depressive disorder, single episode, unspecified: Secondary | ICD-10-CM | POA: Insufficient documentation

## 2015-07-18 LAB — CBC
HCT: 38.6 % (ref 35.0–47.0)
Hemoglobin: 12.7 g/dL (ref 12.0–16.0)
MCH: 29 pg (ref 26.0–34.0)
MCHC: 32.8 g/dL (ref 32.0–36.0)
MCV: 88.5 fL (ref 80.0–100.0)
PLATELETS: 279 10*3/uL (ref 150–440)
RBC: 4.37 MIL/uL (ref 3.80–5.20)
RDW: 16 % — AB (ref 11.5–14.5)
WBC: 13.6 10*3/uL — AB (ref 3.6–11.0)

## 2015-07-18 LAB — POCT PREGNANCY, URINE: Preg Test, Ur: NEGATIVE

## 2015-07-18 LAB — COMPREHENSIVE METABOLIC PANEL
ALT: 11 U/L — ABNORMAL LOW (ref 14–54)
AST: 19 U/L (ref 15–41)
Albumin: 3.5 g/dL (ref 3.5–5.0)
Alkaline Phosphatase: 81 U/L (ref 38–126)
Anion gap: 10 (ref 5–15)
BILIRUBIN TOTAL: 0.4 mg/dL (ref 0.3–1.2)
BUN: 10 mg/dL (ref 6–20)
CO2: 25 mmol/L (ref 22–32)
Calcium: 8.3 mg/dL — ABNORMAL LOW (ref 8.9–10.3)
Chloride: 104 mmol/L (ref 101–111)
Creatinine, Ser: 0.95 mg/dL (ref 0.44–1.00)
Glucose, Bld: 185 mg/dL — ABNORMAL HIGH (ref 65–99)
POTASSIUM: 3.8 mmol/L (ref 3.5–5.1)
Sodium: 139 mmol/L (ref 135–145)
TOTAL PROTEIN: 7.7 g/dL (ref 6.5–8.1)

## 2015-07-18 LAB — ETHANOL

## 2015-07-18 LAB — ACETAMINOPHEN LEVEL: Acetaminophen (Tylenol), Serum: <10 ug/mL — ABNORMAL LOW (ref 10–30)

## 2015-07-18 LAB — SALICYLATE LEVEL: Salicylate Lvl: <4 mg/dL (ref 2.8–30.0)

## 2015-07-18 MED ORDER — SODIUM CHLORIDE 0.9 % IV BOLUS (SEPSIS)
1000.0000 mL | Freq: Once | INTRAVENOUS | Status: AC
  Administered 2015-07-18: 1000 mL via INTRAVENOUS

## 2015-07-18 NOTE — ED Provider Notes (Signed)
[facility] Emergency Department Provider Note  ____________________________________________  Time seen: Approximately 11:24 PM  I have reviewed the triage vital signs and the nursing notes.   HISTORY  Chief Complaint Drug Overdose    HPI Amber Gilmore is a 40 y.o. female brought to the ED from home via EMS with a chief complaint of unintentional heroin overdose. Patient states she was found unresponsive by her mother. She received 3 mg of IV Narcan in route to the hospital and arrives to the emergency department awake, alert, tearful and remorseful. Patient has a long-standing history of heroin abuse s/p 6 attempts at detox. Currently she is on a waiting list for a 90 day court-mandated detox program.She also has a court date later this month. Denies active SI/HI/AH/VH. Report she has overwhelming stressors anyway, but she has also been sick this week for sore throat so she has been feeling "miserable". Denies fever, chills, difficulty swallowing, chest pain, shortness of breath, abdominal pain, nausea, vomiting, diarrhea.   Past medical history None  There are no active problems to display for this patient.   History reviewed. No pertinent past surgical history.  Current Outpatient Rx  Name  Route  Sig  Dispense  Refill  . amoxicillin (AMOXIL) 500 MG tablet   Oral   Take 1 tablet (500 mg total) by mouth 3 (three) times daily.   30 tablet   0   . citalopram (CELEXA) 10 MG tablet   Oral   Take 10 mg by mouth daily.         . clindamycin (CLEOCIN) 300 MG capsule   Oral   Take 1 capsule (300 mg total) by mouth 4 (four) times daily.   28 capsule   0   . lidocaine (XYLOCAINE) 2 % solution   Mouth/Throat   Use as directed 20 mLs in the mouth or throat as needed (as needed for sore throat).   100 mL   0   . magic mouthwash w/lidocaine SOLN   Oral   Take 5 mLs by mouth 4 (four) times daily.   240 mL   0     Dispense in a 1/1/1/1 ratio.  Use lidocaine, diphen ...     Allergies Review of patient's allergies indicates no known allergies.  No family history on file.  Social History Social History  Substance Use Topics  . Smoking status: Current Every Day Smoker  . Smokeless tobacco: None  . Alcohol Use: No    Review of Systems  Constitutional: No fever/chills. Eyes: No visual changes. ENT: Positive for sore throat. Cardiovascular: Denies chest pain. Respiratory: Denies shortness of breath. Gastrointestinal: No abdominal pain.  No nausea, no vomiting.  No diarrhea.  No constipation. Genitourinary: Negative for dysuria. Musculoskeletal: Negative for back pain. Skin: Negative for rash. Neurological: Negative for headaches, focal weakness or numbness. Psychiatric:Positive for depression.  10-point ROS otherwise negative.  ____________________________________________   PHYSICAL EXAM:  VITAL SIGNS: ED Triage Vitals  Enc Vitals Group     BP 07/18/15 2230 132/85 mmHg     Pulse Rate 07/18/15 2230 95     Resp 07/18/15 2230 18     Temp 07/18/15 2230 98.5 F (36.9 C)     Temp Source 07/18/15 2230 Oral     SpO2 07/18/15 2230 100 %     Weight 07/18/15 2230 135 lb (61.236 kg)     Height 07/18/15 2230  (1.676 m)     Head Cir --  Peak Flow --      Pain Score 07/18/15 2231 5     Pain Loc --      Pain Edu? --      Excl. in GC? --     Constitutional: Alert and oriented. Disheveled appearing and in no acute distress. Tearful. Eyes: Conjunctivae are normal. PERRL. EOMI. Head: Atraumatic. Nose: No congestion/rhinnorhea. Mouth/Throat: Mucous membranes are moist.  Oropharynx non-erythematous. Neck: No stridor.   Hematological/Lymphatic/Immunilogical: No cervical lymphadenopathy. Cardiovascular: Normal rate, regular rhythm. Grossly normal heart sounds.  Good peripheral circulation. Respiratory: Normal respiratory effort.  No retractions. Lungs CTAB. Gastrointestinal: Soft and nontender. No distention.  No abdominal bruits. No CVA tenderness. Musculoskeletal: Multiple track marks to bilateral upper extremities. No lower extremity tenderness nor edema.  No joint effusions. Neurologic:  Normal speech and language. No gross focal neurologic deficits are appreciated. No gait instability. Skin:  Skin is warm, dry and intact. No rash noted. Psychiatric: Mood and affect are tearful. Speech and behavior are normal.  ____________________________________________   LABS (all labs ordered are listed, but only abnormal results are displayed)  Labs Reviewed  COMPREHENSIVE METABOLIC PANEL - Abnormal; Notable for the following:    Glucose, Bld 185 (*)    Calcium 8.3 (*)    ALT 11 (*)    All other components within normal limits  ACETAMINOPHEN LEVEL - Abnormal; Notable for the following:    Acetaminophen (Tylenol), Serum <10 (*)    All other components within normal limits  CBC - Abnormal; Notable for the following:    WBC 13.6 (*)    RDW 16.0 (*)    All other components within normal limits  URINE DRUG SCREEN, QUALITATIVE ([facility] ONLY) - Abnormal; Notable for the following:    Cocaine Metabolite,Ur [place] POSITIVE (*)    Opiate, Ur Screen POSITIVE (*)    Benzodiazepine, Ur Scrn POSITIVE (*)    All other components within normal limits  URINALYSIS COMPLETEWITH MICROSCOPIC ([facility] ONLY) - Abnormal; Notable for the following:    Color, Urine YELLOW (*)    APPearance HAZY (*)    Ketones, ur TRACE (*)    Protein, ur 30 (*)    Squamous Epithelial / LPF 6-30 (*)    All other components within normal limits  ETHANOL  SALICYLATE LEVEL  CBG MONITORING, ED  POC URINE PREG, ED  POCT PREGNANCY, URINE   ____________________________________________  EKG  ED ECG REPORT I, SUNG,JADE J, the attending physician, personally viewed and interpreted this ECG.   Date: 07/18/2015  EKG Time: 2229  Rate: 90  Rhythm: normal EKG, normal sinus rhythm  Axis: Normal  Intervals:none  ST&T Change:  Nonspecific  ____________________________________________  RADIOLOGY  None ____________________________________________   PROCEDURES  Procedure(s) performed: None  Critical Care performed: No  ____________________________________________   INITIAL IMPRESSION / ASSESSMENT AND PLAN / ED COURSE  Pertinent labs & imaging results that were available during my care of the patient were reviewed by me and considered in my medical decision making (see chart for details).  40 year old female with history of heroin abuse who presents with unintentional overdose. She denies active SI or HI, but is very depressed. Laboratory results unremarkable. Will ask TTS to evaluate patient in the emergency department. Patient is medically cleared at this time for psychiatric disposition.  ----------------------------------------- 1:21 AM on 07/19/2015 -----------------------------------------  Patient remains awake and oriented in no acute distress, room air saturations 100%. She spoke with TTS Roxanne who found her at that R[facility] but patient declines to  go. Strict return precautions given. Patient verbalizes understanding and agrees with plan of care. ____________________________________________   FINAL CLINICAL IMPRESSION(S) / ED DIAGNOSES  Final diagnoses:  Heroin overdose, accidental or unintentional, initial encounter  Depression  Cocaine abuse      Irean Hong, MD 07/19/15 [phone]

## 2015-07-18 NOTE — ED Notes (Signed)
Pt presents to ED via [facility] c/o heroin overdose. Per EMS pt was found unresponsive by [employer] who administered 3 mg of Narcan. States is on a waiting list for a 90 day detox program at [facility], reports she has been clean since January 2017. Pt states "I have taken morphine and xanax today.Marland KitchenMarland KitchenI have been miserable this week." Pt has been seen here x 2 this week for sore throat, states was given antibiotics for it but reports has not been feeling better. Pt tearful and anxious am so tired of disappointing the people who love me." Pt denies SI or HI. Pt alert and oriented x 4, denies chest pain or shortness of breath. No increased work in breathing noted.

## 2015-07-19 LAB — URINE DRUG SCREEN, QUALITATIVE (ARMC ONLY)
Amphetamines, Ur Screen: NOT DETECTED
Barbiturates, Ur Screen: NOT DETECTED
Benzodiazepine, Ur Scrn: POSITIVE — AB
CANNABINOID 50 NG, UR ~~LOC~~: NOT DETECTED
COCAINE METABOLITE, UR ~~LOC~~: POSITIVE — AB
MDMA (ECSTASY) UR SCREEN: NOT DETECTED
Methadone Scn, Ur: NOT DETECTED
Opiate, Ur Screen: POSITIVE — AB
PHENCYCLIDINE (PCP) UR S: NOT DETECTED
Tricyclic, Ur Screen: NOT DETECTED

## 2015-07-19 LAB — URINALYSIS COMPLETE WITH MICROSCOPIC (ARMC ONLY)
BACTERIA UA: NONE SEEN
Bilirubin Urine: NEGATIVE
Glucose, UA: NEGATIVE mg/dL
HGB URINE DIPSTICK: NEGATIVE
Leukocytes, UA: NEGATIVE
Nitrite: NEGATIVE
PH: 5 (ref 5.0–8.0)
PROTEIN: 30 mg/dL — AB
SPECIFIC GRAVITY, URINE: 1.023 (ref 1.005–1.030)

## 2015-07-19 NOTE — ED Notes (Signed)

## 2015-07-19 NOTE — Discharge Instructions (Signed)
Return to the ER for worsening symptoms, persistent vomiting, lethargy, difficulty breathing or other concerns.  Accidental Overdose A drug overdose occurs when a chemical substance (drug or medication) is used in amounts large enough to overcome a person. This may result in severe illness or death. This is a type of poisoning. Accidental overdoses of medications or other substances come from a variety of reasons. When this happens accidentally, it is often because the person taking the substance does not know enough about what they have taken. Drugs which commonly cause overdose deaths are alcohol, psychotropic medications (medications which affect the mind), pain medications, illegal drugs (street drugs) such as cocaine and heroin, and multiple drugs taken at the same time. It may result from careless behavior (such as over-indulging at a party). Other causes of overdose may include multiple drug use, a lapse in memory, or drug use after a period of no drug use.  Sometimes overdosing occurs because a person cannot remember if they have taken their medication.  A common unintentional overdose in young children involves multi-vitamins containing iron. Iron is a part of the hemoglobin molecule in blood. It is used to transport oxygen to living cells. When taken in small amounts, iron allows the body to restock hemoglobin. In large amounts, it causes problems in the body. If this overdose is not treated, it can lead to death. Never take medicines that show signs of tampering or do not seem quite right. Never take medicines in the dark or in poor lighting. Read the label and check each dose of medicine before you take it. When adults are poisoned, it happens most often through carelessness or lack of information. Taking medicines in the dark or taking medicine prescribed for someone else to treat the same type of problem is a dangerous practice. SYMPTOMS  Symptoms of overdose depend on the medication and amount  taken. They can vary from over-activity with stimulant over-dosage, to sleepiness from depressants such as alcohol, narcotics and tranquilizers. Confusion, dizziness, nausea and vomiting may be present. If problems are severe enough coma and death may result. DIAGNOSIS  Diagnosis and management are generally straightforward if the drug is known. Otherwise it is more difficult. At times, certain symptoms and signs exhibited by the patient, or blood tests, can reveal the drug in question.  TREATMENT  In an emergency department, most patients can be treated with supportive measures. Antidotes may be available if there has been an overdose of opioids or benzodiazepines. A rapid improvement will often occur if this is the cause of overdose. At home or away from medical care:  There may be no immediate problems or warning signs in children.  Not everything works well in all cases of poisoning.  Take immediate action. Poisons may act quickly.  If you think someone has swallowed medicine or a household product, and the person is unconscious, having seizures (convulsions), or is not breathing, immediately call for an ambulance. IF a person is conscious and appears to be doing OK but has swallowed a poison:  Do not wait to see what effect the poison will have. Immediately call a poison control center (listed in the white pages of your telephone book under "Poison Control" or inside the front cover with other emergency numbers). Some poison control centers have TTY capability for the deaf. Check with your local center if you or someone in your family requires this service.  Keep the container so you can read the label on the product for ingredients.  Describe  what, when, and how much was taken and the age and condition of the person poisoned. Inform them if the person is vomiting, choking, drowsy, shows a change in color or temperature of skin, is conscious or unconscious, or is convulsing.  Do not cause  vomiting unless instructed by medical personnel. Do not induce vomiting or force liquids into a person who is convulsing, unconscious, or very drowsy. Stay calm and in control.   Activated charcoal also is sometimes used in certain types of poisoning and you may wish to add a supply to your emergency medicines. It is available without a prescription. Call a poison control center before using this medication. PREVENTION  Thousands of children die every year from unintentional poisoning. This may be from household chemicals, poisoning from carbon monoxide in a car, taking their parent's medications, or simply taking a few iron pills or vitamins with iron. Poisoning comes from unexpected sources.  Store medicines out of the sight and reach of children, preferably in a locked cabinet. Do not keep medications in a food cabinet. Always store your medicines in a secure place. Get rid of expired medications.  If you have children living with you or have them as occasional guests, you should have child-resistant caps on your medicine containers. Keep everything out of reach. Child proof your home.  If you are called to the telephone or to answer the door while you are taking a medicine, take the container with you or put the medicine out of the reach of small children.  Do not take your medication in front of children. Do not tell your child how good a medication is and how good it is for them. They may get the idea it is more of a treat.  If you are an adult and have accidentally taken an overdose, you need to consider how this happened and what can be done to prevent it from happening again. If this was from a street drug or alcohol, determine if there is a problem that needs addressing. If you are not sure a problems exists, it is easy to talk to a professional and ask them if they think you have a problem. It is better to handle this problem in this way before it happens again and has a much worse  consequence.   This information is not intended to replace advice given to you by your health care provider. Make sure you discuss any questions you have with your health care provider.   Document Released: 07/19/2004 Document Revised: 05/26/2014 Document Reviewed: 10/23/2014 Elsevier Interactive Patient Education 2016 [employer].  Narcotic Overdose A narcotic overdose is the misuse or overuse of a narcotic drug. A narcotic overdose can make you pass out and stop breathing. If you are not treated right away, this can cause permanent brain damage or stop your heart. Medicine may be given to reverse the effects of an overdose. If so, this medicine may bring on withdrawal symptoms. The symptoms may be abdominal cramps, throwing up (vomiting), sweating, chills, and nervousness. Injecting narcotics can cause more problems than just an overdose. AIDS, hepatitis, and other very serious infections are transmitted by sharing needles and syringes. If you decide to quit using, there are medicines which can help you through the withdrawal period. Trying to quit all at once on your own can be uncomfortable, but not life-threatening. Call your caregiver, Narcotics Anonymous, or any drug and alcohol treatment program for further help.    This information is not intended to  replace advice given to you by your health care provider. Make sure you discuss any questions you have with your health care provider.   Document Released: 06/12/2004 Document Revised: 05/26/2014 Document Reviewed: 10/25/2014 Elsevier Interactive Patient Education 2016 [employer] Inc[employer]  Stimulant Use Disorder-Cocaine Cocaine is one of a group of powerful drugs called stimulants. Cocaine has medical uses for stopping nosebleeds and for pain control before minor nose or dental surgery. However, cocaine is misused because of the effects that it produces. These effects include:   A feeling of extreme pleasure.  Alertness.  High  energy. Common street names for cocaine include coke, crack, blow, snow, and nose candy. Cocaine is snorted, dissolved in water and injected, or smoked.  Stimulants are addictive because they activate regions of the brain that produce both the pleasurable sensation of "reward" and psychological dependence. Together, these actions account for loss of control and the rapid development of drug dependence. This means you become ill without the drug (withdrawal) and need to keep using it to function.  Stimulant use disorder is use of stimulants that disrupts your daily life. It disrupts relationships with family and friends and how you do your job. Cocaine increases your blood pressure and heart rate. It can cause a heart attack or stroke. Cocaine can also cause death from irregular heart rate or seizures. SYMPTOMS Symptoms of stimulant use disorder with cocaine include:  Use of cocaine in larger amounts or over a longer period of time than intended.  Unsuccessful attempts to cut down or control cocaine use.  A lot of time spent obtaining, using, or recovering from the effects of cocaine.  A strong desire or urge to use cocaine (craving).  Continued use of cocaine in spite of major problems at work, school, or home because of use.  Continued use of cocaine in spite of relationship problems because of use.  Giving up or cutting down on important life activities because of cocaine use.  Use of cocaine over and over in situations when it is physically hazardous, such as driving a car.  Continued use of cocaine in spite of a physical problem that is likely related to use. Physical problems can include:  Malnutrition.  Nosebleeds.  Chest pain.  High blood pressure.  A hole that develops between the part of your nose that separates your nostrils (perforated nasal septum).  Lung and kidney damage.  Continued use of cocaine in spite of a mental problem that is likely related to use. Mental  problems can include:  Schizophrenia-like symptoms.  Depression.  Bipolar mood swings.  Anxiety.  Sleep problems.  Need to use more and more cocaine to get the same effect, or lessened effect over time with use of the same amount of cocaine (tolerance).  Having withdrawal symptoms when cocaine use is stopped, or using cocaine to reduce or avoid withdrawal symptoms. Withdrawal symptoms include:  Depressed or irritable mood.  Low energy or restlessness.  Bad dreams.  Poor or excessive sleep.  Increased appetite. DIAGNOSIS Stimulant use disorder is diagnosed by your health care provider. You may be asked questions about your cocaine use and how it affects your life. A physical exam may be done. A drug screen may be ordered. You may be referred to a mental health professional. The diagnosis of stimulant use disorder requires at least two symptoms within 12 months. The type of stimulant use disorder depends on the number of signs and symptoms you have. The type may be:  Mild. Two or three  signs and symptoms.  Moderate. Four or five signs and symptoms.  Severe. Six or more signs and symptoms. TREATMENT Treatment for stimulant use disorder is usually provided by mental health professionals with training in substance use disorders. The following options are available:  Counseling or talk therapy. Talk therapy addresses the reasons you use cocaine and ways to keep you from using again. Goals of talk therapy include:  Identifying and avoiding triggers for use.  Handling cravings.  Replacing use with healthy activities.  Support groups. Support groups provide emotional support, advice, and guidance.  Medicine. Certain medicines may decrease cocaine cravings or withdrawal symptoms. HOME CARE INSTRUCTIONS  Take medicines only as directed by your health care provider.  Identify the people and activities that trigger your cocaine use and avoid them.  Keep all follow-up visits as  directed by your health care provider. SEEK MEDICAL CARE IF:  Your symptoms get worse or you relapse.  You are not able to take medicines as directed. SEEK IMMEDIATE MEDICAL CARE IF:  You have serious thoughts about hurting yourself or others.  You have a seizure, chest pain, sudden weakness, or loss of speech or vision. FOR MORE INFORMATION  National Institute on Drug Abuse: http://www.price-smith.com/  Substance Abuse and Mental Health Services Administration: SkateOasis.com.pt   This information is not intended to replace advice given to you by your health care provider. Make sure you discuss any questions you have with your health care provider.   Document Released: 05/02/2000 Document Revised: 05/26/2014 Document Reviewed: 05/18/2013 Elsevier Interactive Patient Education 2016 Else[employer] Inc.  Major Depressive Disorder Major depressive disorder is a mental illness. It also may be called clinical depression or unipolar depression. Major depressive disorder usually causes feelings of sadness, hopelessness, or helplessness. Some people with this disorder do not feel particularly sad but lose interest in doing things they used to enjoy (anhedonia). Major depressive disorder also can cause physical symptoms. It can interfere with work, school, relationships, and other normal everyday activities. The disorder varies in severity but is longer lasting and more serious than the sadness we all feel from time to time in our lives. Major depressive disorder often is triggered by stressful life events or major life changes. Examples of these triggers include divorce, loss of your job or home, a move, and the death of a family member or close friend. Sometimes this disorder occurs for no obvious reason at all. People who have family members with major depressive disorder or bipolar disorder are at higher risk for developing this disorder, with or without life stressors. Major depressive disorder can occur at any  age. It may occur just once in your life (single episode major depressive disorder). It may occur multiple times (recurrent major depressive disorder). SYMPTOMS People with major depressive disorder have either anhedonia or depressed mood on nearly a daily basis for at least 2 weeks or longer. Symptoms of depressed mood include:  Feelings of sadness (blue or down in the dumps) or emptiness.  Feelings of hopelessness or helplessness.  Tearfulness or episodes of crying (may be observed by others).  Irritability (children and adolescents). In addition to depressed mood or anhedonia or both, people with this disorder have at least four of the following symptoms:  Difficulty sleeping or sleeping too much.   Significant change (increase or decrease) in appetite or weight.   Lack of energy or motivation.  Feelings of guilt and worthlessness.   Difficulty concentrating, remembering, or making decisions.  Unusually slow movement (psychomotor retardation) or  restlessness (as observed by others).   Recurrent wishes for death, recurrent thoughts of self-harm (suicide), or a suicide attempt. People with major depressive disorder commonly have persistent negative thoughts about themselves, other people, and the world. People with severe major depressive disorder may experiencedistorted beliefs or perceptions about the world (psychotic delusions). They also may see or hear things that are not real (psychotic hallucinations). DIAGNOSIS Major depressive disorder is diagnosed through an assessment by your health care provider. Your health care provider will ask aboutaspects of your daily life, such as mood,sleep, and appetite, to see if you have the diagnostic symptoms of major depressive disorder. Your health care provider may ask about your medical history and use of alcohol or drugs, including prescription medicines. Your health care provider also may do a physical exam and blood work. This is  because certain medical conditions and the use of certain substances can cause major depressive disorder-like symptoms (secondary depression). Your health care provider also may refer you to a mental health specialist for further evaluation and treatment. TREATMENT It is important to recognize the symptoms of major depressive disorder and seek treatment. The following treatments can be prescribed for this disorder:   Medicine. Antidepressant medicines usually are prescribed. Antidepressant medicines are thought to correct chemical imbalances in the brain that are commonly associated with major depressive disorder. Other types of medicine may be added if the symptoms do not respond to antidepressant medicines alone or if psychotic delusions or hallucinations occur.  Talk therapy. Talk therapy can be helpful in treating major depressive disorder by providing support, education, and guidance. Certain types of talk therapy also can help with negative thinking (cognitive behavioral therapy) and with relationship issues that trigger this disorder (interpersonal therapy). A mental health specialist can help determine which treatment is best for you. Most people with major depressive disorder do well with a combination of medicine and talk therapy. Treatments involving electrical stimulation of the brain can be used in situations with extremely severe symptoms or when medicine and talk therapy do not work over time. These treatments include electroconvulsive therapy, transcranial magnetic stimulation, and vagal nerve stimulation.   This information is not intended to replace advice given to you by your health care provider. Make sure you discuss any questions you have with your health care provider.   Document Released: 08/30/2012 Document Revised: 05/26/2014 Document Reviewed: 08/30/2012 Elsevier Interactive Patient Education [employer]

## 2015-09-19 ENCOUNTER — Emergency Department
Admission: EM | Admit: 2015-09-19 | Discharge: 2015-09-19 | Disposition: A | Discharge status: Home / Self Care | Payer: Self-pay | Attending: Emergency Medicine | Admitting: Emergency Medicine

## 2015-09-19 DIAGNOSIS — F1910 Other psychoactive substance abuse, uncomplicated: Secondary | ICD-10-CM

## 2015-09-19 DIAGNOSIS — Z792 Long term (current) use of antibiotics: Secondary | ICD-10-CM | POA: Insufficient documentation

## 2015-09-19 DIAGNOSIS — F17200 Nicotine dependence, unspecified, uncomplicated: Secondary | ICD-10-CM | POA: Insufficient documentation

## 2015-09-19 DIAGNOSIS — T401X1A Poisoning by heroin, accidental (unintentional), initial encounter: Secondary | ICD-10-CM | POA: Insufficient documentation

## 2015-09-19 DIAGNOSIS — Z79899 Other long term (current) drug therapy: Secondary | ICD-10-CM | POA: Insufficient documentation

## 2015-09-19 DIAGNOSIS — F1110 Opioid abuse, uncomplicated: Secondary | ICD-10-CM | POA: Insufficient documentation

## 2015-09-19 LAB — CBC
HCT: 43.5 % (ref 35.0–47.0)
Hemoglobin: 14.2 g/dL (ref 12.0–16.0)
MCH: 28.4 pg (ref 26.0–34.0)
MCHC: 32.6 g/dL (ref 32.0–36.0)
MCV: 87.2 fL (ref 80.0–100.0)
Platelets: 262 10*3/uL (ref 150–440)
RBC: 4.99 MIL/uL (ref 3.80–5.20)
RDW: 15.3 % — ABNORMAL HIGH (ref 11.5–14.5)
WBC: 7.6 10*3/uL (ref 3.6–11.0)

## 2015-09-19 LAB — COMPREHENSIVE METABOLIC PANEL
ALK PHOS: 60 U/L (ref 38–126)
ALT: 16 U/L (ref 14–54)
ANION GAP: 13 (ref 5–15)
AST: 30 U/L (ref 15–41)
Albumin: 4 g/dL (ref 3.5–5.0)
BILIRUBIN TOTAL: 0.4 mg/dL (ref 0.3–1.2)
BUN: 12 mg/dL (ref 6–20)
CALCIUM: 8.9 mg/dL (ref 8.9–10.3)
CO2: 24 mmol/L (ref 22–32)
CREATININE: 1.02 mg/dL — AB (ref 0.44–1.00)
Chloride: 98 mmol/L — ABNORMAL LOW (ref 101–111)
GFR calc non Af Amer: >60 mL/min (ref >60)
GLUCOSE: 228 mg/dL — AB (ref 65–99)
Potassium: 4.3 mmol/L (ref 3.5–5.1)
Sodium: 135 mmol/L (ref 135–145)
TOTAL PROTEIN: 7.5 g/dL (ref 6.5–8.1)

## 2015-09-19 LAB — SALICYLATE LEVEL: Salicylate Lvl: <4 mg/dL (ref 2.8–30.0)

## 2015-09-19 LAB — URINE DRUG SCREEN, QUALITATIVE (ARMC ONLY)
Amphetamines, Ur Screen: POSITIVE — AB
BARBITURATES, UR SCREEN: NOT DETECTED
BENZODIAZEPINE, UR SCRN: NOT DETECTED
CANNABINOID 50 NG, UR ~~LOC~~: NOT DETECTED
COCAINE METABOLITE, UR ~~LOC~~: POSITIVE — AB
MDMA (Ecstasy)Ur Screen: NOT DETECTED
METHADONE SCREEN, URINE: NOT DETECTED
OPIATE, UR SCREEN: POSITIVE — AB
PHENCYCLIDINE (PCP) UR S: NOT DETECTED
Tricyclic, Ur Screen: NOT DETECTED

## 2015-09-19 LAB — ACETAMINOPHEN LEVEL

## 2015-09-19 LAB — ETHANOL: Alcohol, Ethyl (B): <5 mg/dL (ref <5)

## 2015-09-19 MED ORDER — SODIUM CHLORIDE 0.9 % IV BOLUS (SEPSIS)
1000.0000 mL | Freq: Once | INTRAVENOUS | Status: AC
  Administered 2015-09-19: 1000 mL via INTRAVENOUS

## 2015-09-19 NOTE — Discharge Instructions (Signed)
Narcotic Overdose A narcotic overdose is the misuse or overuse of a narcotic drug. A narcotic overdose can make you pass out and stop breathing. If you are not treated right away, this can cause permanent brain damage or stop your heart. Medicine may be given to reverse the effects of an overdose. If so, this medicine may bring on withdrawal symptoms. The symptoms may be abdominal cramps, throwing up (vomiting), sweating, chills, and nervousness. Injecting narcotics can cause more problems than just an overdose. AIDS, hepatitis, and other very serious infections are transmitted by sharing needles and syringes. If you decide to quit using, there are medicines which can help you through the withdrawal period. Trying to quit all at once on your own can be uncomfortable, but not life-threatening. Call your caregiver, Narcotics Anonymous, or any drug and alcohol treatment program for further help.    This information is not intended to replace advice given to you by your health care provider. Make sure you discuss any questions you have with your health care provider.   Document Released: 06/12/2004 Document Revised: 05/26/2014 Document Reviewed: 10/25/2014 Elsevier Interactive Patient Education 2016 [employer]  Polysubstance Abuse When people abuse more than one drug or type of drug it is called polysubstance or polydrug abuse. For example, many smokers also drink alcohol. This is one form of polydrug abuse. Polydrug abuse also refers to the use of a drug to counteract an unpleasant effect produced by another drug. It may also be used to help with withdrawal from another drug. People who take stimulants may become agitated. Sometimes this agitation is countered with a tranquilizer. This helps protect against the unpleasant side effects. Polydrug abuse also refers to the use of different drugs at the same time.  Anytime drug use is interfering with normal living activities, it has become abuse. This  includes problems with family and friends. Psychological dependence has developed when your mind tells you that the drug is needed. This is usually followed by physical dependence which has developed when continuing increases of drug are required to get the same feeling or "high". This is known as addiction or chemical dependency. A person's risk is much higher if there is a history of chemical dependency in the family. SIGNS OF CHEMICAL DEPENDENCY  You have been told by friends or family that drugs have become a problem.  You fight when using drugs.  You are having blackouts (not remembering what you do while using).  You feel sick from using drugs but continue using.  You lie about use or amounts of drugs (chemicals) used.  You need chemicals to get you going.  You are suffering in work performance or in school because of drug use.  You get sick from use of drugs but continue to use anyway.  You need drugs to relate to people or feel comfortable in social situations.  You use drugs to forget problems. "Yes" answered to any of the above signs of chemical dependency indicates there are problems. The longer the use of drugs continues, the greater the problems will become. If there is a family history of drug or alcohol use, it is best not to experiment with these drugs. Continual use leads to tolerance. After tolerance develops more of the drug is needed to get the same feeling. This is followed by addiction. With addiction, drugs become the most important part of life. It becomes more important to take drugs than participate in the other usual activities of life. This includes relating to  friends and family. Addiction is followed by dependency. Dependency is a condition where drugs are now needed not just to get high, but to feel normal. Addiction cannot be cured but it can be stopped. This often requires outside help and the care of professionals. Treatment centers are listed in the yellow  pages under: Cocaine, Narcotics, and Alcoholics Anonymous. Most hospitals and clinics can refer you to a specialized care center. Talk to your caregiver if you need help.   This information is not intended to replace advice given to you by your health care provider. Make sure you discuss any questions you have with your health care provider.   Document Released: 12/25/2004 Document Revised: 07/28/2011 Document Reviewed: 05/10/2014 Elsevier Interactive Patient Education [employer]

## 2015-09-19 NOTE — ED Notes (Signed)

## 2015-09-19 NOTE — ED Provider Notes (Signed)
[facility] Emergency Department Provider Note  ____________________________________________  Time seen: Approximately 7:49 PM  I have reviewed the triage vital signs and the nursing notes.   HISTORY  Chief Complaint Drug Overdose    HPI Amber Gilmore is a 40 y.o. female with a long history of substance abuse disorder on primarily heroin but "I will take anything I can get".  She presents by EMS today after an accidental heroin overdose which may have been laced with fentanyl.  She states that after she injected she immediately knew that she had gotten too much and she tried to let someone know to get her own personal supply of Narcan but she fell unconscious.  When her husband found her she was not responding but was still breathing.  EMS was called and apparently she stopped breathing just seconds before EMS arrived.  She was awake and alert after 4 mg of Narcan.  Though her initial symptoms were severe, she currently feels normal and denies headache, nausea, vomiting, chest pain, shortness of breath, abdominal pain, and dysuria. The onset of the overdose was acute, occurring immediately after injecting.   History reviewed. No pertinent past medical history.  There are no active problems to display for this patient.   History reviewed. No pertinent past surgical history.  Current Outpatient Rx  Name  Route  Sig  Dispense  Refill  . amoxicillin (AMOXIL) 500 MG tablet   Oral   Take 1 tablet (500 mg total) by mouth 3 (three) times daily.   30 tablet   0   . citalopram (CELEXA) 10 MG tablet   Oral   Take 10 mg by mouth daily.         . clindamycin (CLEOCIN) 300 MG capsule   Oral   Take 1 capsule (300 mg total) by mouth 4 (four) times daily.   28 capsule   0   . lidocaine (XYLOCAINE) 2 % solution   Mouth/Throat   Use as directed 20 mLs in the mouth or throat as needed (as needed for sore throat).   100 mL   0   . magic mouthwash  w/lidocaine SOLN   Oral   Take 5 mLs by mouth 4 (four) times daily.   240 mL   0     Dispense in a 1/1/1/1 ratio. Use lidocaine, diphen ...     Allergies Review of patient's allergies indicates no known allergies.  No family history on file.  Social History Social History  Substance Use Topics  . Smoking status: Current Every Day Smoker  . Smokeless tobacco: None  . Alcohol Use: No    Review of Systems Constitutional: No fever/chills Eyes: No visual changes. ENT: No sore throat. Cardiovascular: Denies chest pain. Respiratory: Denies shortness of breath. Gastrointestinal: No abdominal pain.  No nausea, no vomiting.  No diarrhea.  No constipation. Genitourinary: Negative for dysuria. Musculoskeletal: Negative for back pain. Skin: Negative for rash. Neurological: Negative for headaches, focal weakness or numbness.  10-point ROS otherwise negative.  ____________________________________________   PHYSICAL EXAM:  VITAL SIGNS: ED Triage Vitals  Enc Vitals Group     BP 09/19/15 1904 136/96 mmHg     Pulse Rate 09/19/15 1904 88     Resp 09/19/15 1904 16     Temp 09/19/15 1904 98.1 F (36.7 C)     Temp Source 09/19/15 1904 Oral     SpO2 09/19/15 1904 100 %     Weight 09/19/15 1904 130 lb (58.968  kg)     Height 09/19/15 1904  (1.676 m)     Head Cir --      Peak Flow --      Pain Score --      Pain Loc --      Pain Edu? --      Excl. in GC? --     Constitutional: Alert and oriented. No acute distress but has the appearance of long-term drug abuse Eyes: Conjunctivae are normal. PERRL. EOMI. Head: Atraumatic. Nose: No congestion/rhinnorhea. Mouth/Throat: Mucous membranes are moist.  Oropharynx non-erythematous. Neck: No stridor.  No meningeal signs.   Cardiovascular: Normal rate, regular rhythm. Good peripheral circulation. Grossly normal heart sounds.   Respiratory: Normal respiratory effort.  No retractions. Lungs CTAB. Gastrointestinal: Soft and  nontender. No distention.  Musculoskeletal: No lower extremity tenderness nor edema. No gross deformities of extremities. Neurologic:  Normal speech and language. No gross focal neurologic deficits are appreciated.  Skin:  Skin is warm and dry and pale.  She has numerous track marks and small skin popping wounds over her arms as well as lesions on her face and a subacute lesion on her upper lip.  None of them look infected and she has no evidence of cellulitis. Psychiatric: Mood and affect are normal. Speech and behavior are normal.  Denies SI/HI  ____________________________________________   LABS (all labs ordered are listed, but only abnormal results are displayed)  Labs Reviewed  COMPREHENSIVE METABOLIC PANEL - Abnormal; Notable for the following:    Chloride 98 (*)    Glucose, Bld 228 (*)    Creatinine, Ser 1.02 (*)    All other components within normal limits  ACETAMINOPHEN LEVEL - Abnormal; Notable for the following:    Acetaminophen (Tylenol), Serum <10 (*)    All other components within normal limits  CBC - Abnormal; Notable for the following:    RDW 15.3 (*)    All other components within normal limits  URINE DRUG SCREEN, QUALITATIVE ([facility] ONLY) - Abnormal; Notable for the following:    Amphetamines, Ur Screen POSITIVE (*)    Cocaine Metabolite,Ur [place] POSITIVE (*)    Opiate, Ur Screen POSITIVE (*)    All other components within normal limits  ETHANOL  SALICYLATE LEVEL  PREGNANCY, URINE  CBG MONITORING, ED   ____________________________________________  EKG  ED ECG REPORT I, Amron Guerrette, the attending physician, personally viewed and interpreted this ECG.  Date: 09/19/2015 EKG Time: 19:03 Rate: 86 Rhythm: normal sinus rhythm QRS Axis: normal Intervals: normal ST/T Wave abnormalities: normal Conduction Disturbances: none Narrative Interpretation: unremarkable with no evidence of acute  ischemia  ____________________________________________  RADIOLOGY   No results found.  ____________________________________________   PROCEDURES  Procedure(s) performed: None  Critical Care performed: No ____________________________________________   INITIAL IMPRESSION / ASSESSMENT AND PLAN / ED COURSE  Pertinent labs & imaging results that were available during my care of the patient were reviewed by me and considered in my medical decision making (see chart for details).  No indication for involuntary commitment.  The patient states that this was an accident and she are to has a lot of information about outpatient drug treatment programs.  She declines talking to TTS at this time which I feel is appropriate.  We will watch her for a couple more hours to make sure that she does not get more ill again after the Narcan wears off completely.  She agrees with this plan.   (Note that documentation was delayed due to multiple  ED patients requiring immediate care.)   The patient was stable for about 2 hours in the emergency department, able to eat and drink without difficulty.  Her husband is present and so were and willing and able to take her home and watch her.  I gave my usual and customary management recommendations and return precautions. ____________________________________________  FINAL CLINICAL IMPRESSION(S) / ED DIAGNOSES  Final diagnoses:  Accidental heroin overdose, initial encounter  Substance abuse     MEDICATIONS GIVEN DURING THIS VISIT:  Medications  sodium chloride 0.9 % bolus 1,000 mL (0 mLs Intravenous Stopped 09/19/15 2030)     NEW OUTPATIENT MEDICATIONS STARTED DURING THIS VISIT:  Discharge Medication List as of 09/19/2015  8:52 PM        Note:  This document was prepared using Dragon voice recognition software and may include unintentional dictation errors.   Loleta Rose, MD 09/19/15 2322

## 2015-09-19 NOTE — ED Notes (Signed)
Pt arrives to ER via [facility] from home. Husband found patient in bed, unconscious. Pt received 4mg  of Narcan total. Pt states that she used heroin and fentanyl today; pt uses once daily. Pt alert and oriented X 4 at this time, denies other drug use.

## 2015-09-20 LAB — PREGNANCY, URINE: Preg Test, Ur: NEGATIVE

## 2015-09-21 ENCOUNTER — Emergency Department
Admission: EM | Admit: 2015-09-21 | Discharge: 2015-09-21 | Disposition: A | Discharge status: Home / Self Care | Payer: Self-pay | Attending: Emergency Medicine | Admitting: Emergency Medicine

## 2015-09-21 DIAGNOSIS — T401X1A Poisoning by heroin, accidental (unintentional), initial encounter: Secondary | ICD-10-CM | POA: Insufficient documentation

## 2015-09-21 DIAGNOSIS — F17200 Nicotine dependence, unspecified, uncomplicated: Secondary | ICD-10-CM | POA: Insufficient documentation

## 2015-09-21 DIAGNOSIS — R519 Headache, unspecified: Secondary | ICD-10-CM

## 2015-09-21 DIAGNOSIS — F1490 Cocaine use, unspecified, uncomplicated: Secondary | ICD-10-CM | POA: Insufficient documentation

## 2015-09-21 DIAGNOSIS — R51 Headache: Secondary | ICD-10-CM | POA: Insufficient documentation

## 2015-09-21 DIAGNOSIS — F1510 Other stimulant abuse, uncomplicated: Secondary | ICD-10-CM | POA: Insufficient documentation

## 2015-09-21 LAB — CBC
HCT: 42 % (ref 35.0–47.0)
Hemoglobin: 13.4 g/dL (ref 12.0–16.0)
MCH: 27.9 pg (ref 26.0–34.0)
MCHC: 32 g/dL (ref 32.0–36.0)
MCV: 87.3 fL (ref 80.0–100.0)
PLATELETS: 297 10*3/uL (ref 150–440)
RBC: 4.81 MIL/uL (ref 3.80–5.20)
RDW: 15.7 % — ABNORMAL HIGH (ref 11.5–14.5)
WBC: 25.2 10*3/uL — ABNORMAL HIGH (ref 3.6–11.0)

## 2015-09-21 LAB — COMPREHENSIVE METABOLIC PANEL
ALK PHOS: 68 U/L (ref 38–126)
ALT: 21 U/L (ref 14–54)
AST: 45 U/L — ABNORMAL HIGH (ref 15–41)
Albumin: 4.1 g/dL (ref 3.5–5.0)
Anion gap: 14 (ref 5–15)
BILIRUBIN TOTAL: 0.5 mg/dL (ref 0.3–1.2)
BUN: 10 mg/dL (ref 6–20)
CALCIUM: 9.3 mg/dL (ref 8.9–10.3)
CO2: 25 mmol/L (ref 22–32)
CREATININE: 1.19 mg/dL — AB (ref 0.44–1.00)
Chloride: 104 mmol/L (ref 101–111)
GFR, EST NON AFRICAN AMERICAN: 57 mL/min — AB (ref >60)
Glucose, Bld: 177 mg/dL — ABNORMAL HIGH (ref 65–99)
Potassium: 4.6 mmol/L (ref 3.5–5.1)
SODIUM: 143 mmol/L (ref 135–145)
Total Protein: 7.6 g/dL (ref 6.5–8.1)

## 2015-09-21 LAB — URINALYSIS COMPLETE WITH MICROSCOPIC (ARMC ONLY)
BACTERIA UA: NONE SEEN
Bilirubin Urine: NEGATIVE
Glucose, UA: 150 mg/dL — AB
HGB URINE DIPSTICK: NEGATIVE
KETONES UR: NEGATIVE mg/dL
LEUKOCYTES UA: NEGATIVE
NITRITE: NEGATIVE
PH: 5 (ref 5.0–8.0)
PROTEIN: 30 mg/dL — AB
SPECIFIC GRAVITY, URINE: 1.011 (ref 1.005–1.030)

## 2015-09-21 LAB — GLUCOSE, CAPILLARY: GLUCOSE-CAPILLARY: 125 mg/dL — AB (ref 65–99)

## 2015-09-21 LAB — URINE DRUG SCREEN, QUALITATIVE (ARMC ONLY)
Amphetamines, Ur Screen: POSITIVE — AB
BARBITURATES, UR SCREEN: NOT DETECTED
BENZODIAZEPINE, UR SCRN: NOT DETECTED
CANNABINOID 50 NG, UR ~~LOC~~: NOT DETECTED
Cocaine Metabolite,Ur ~~LOC~~: POSITIVE — AB
MDMA (Ecstasy)Ur Screen: NOT DETECTED
Methadone Scn, Ur: NOT DETECTED
Opiate, Ur Screen: POSITIVE — AB
Phencyclidine (PCP) Ur S: NOT DETECTED
TRICYCLIC, UR SCREEN: NOT DETECTED

## 2015-09-21 LAB — SALICYLATE LEVEL

## 2015-09-21 LAB — ACETAMINOPHEN LEVEL: Acetaminophen (Tylenol), Serum: <10 ug/mL — ABNORMAL LOW (ref 10–30)

## 2015-09-21 LAB — POCT PREGNANCY, URINE: PREG TEST UR: NEGATIVE

## 2015-09-21 LAB — ETHANOL

## 2015-09-21 MED ORDER — SODIUM CHLORIDE 0.9 % IV BOLUS (SEPSIS)
1000.0000 mL | Freq: Once | INTRAVENOUS | Status: AC
  Administered 2015-09-21: 1000 mL via INTRAVENOUS

## 2015-09-21 MED ORDER — ACETAMINOPHEN 325 MG PO TABS
650.0000 mg | ORAL_TABLET | Freq: Once | ORAL | Status: AC
  Administered 2015-09-21: 650 mg via ORAL
  Filled 2015-09-21: qty 2

## 2015-09-21 NOTE — ED Notes (Signed)
Pt arrives to ER via [facility] from home. Pt found unresponsive at home. Pt admits to using IV heroin PTA and also subaxone. Pt received 2mg  narcan intranasally and 2mg  narcan IV. 4mg  Zofran IV. Pt initial oxygen saturation at home was 39%. Pt alert and oriented X 4 at this time, pt has vomit in her hair.

## 2015-09-21 NOTE — ED Notes (Signed)
Pt still arouses when spoken to - resting quietly without complaint

## 2015-09-21 NOTE — ED Notes (Signed)
Pt is A&O X4 - charge nurse and first nurse notified of pt discharge to lobby without a ride home - pt given access to phone to find ride home
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
## 2015-09-21 NOTE — ED Provider Notes (Signed)
[facility] Emergency Department Provider Note  ____________________________________________  Time seen: Approximately 12:30 PM  I have reviewed the triage vital signs and the nursing notes.   HISTORY  Chief Complaint Drug Overdose    HPI Amber Gilmore is a 40 y.o. female with a history of polysubstance abuse presenting with heroine overdose. The patient reports that she accidentally overdosed on heroin this morning. She was given 2 mg intranasally and 2 mg IV of Narcan by EMS as well as 4 mg of Zofran after multiple episodes of vomiting. Originally, she was unresponsive and had a decreased respiratory drive with reported oxygen saturations by EMS of 39%. At this time, the patient reports that she has a headache but is otherwise asymptomatic.The patient denies any SI, HI or hallucinations. The patient reports that she is interested in getting help for her drug abuse.   History reviewed. No pertinent past medical history.  There are no active problems to display for this patient.   History reviewed. No pertinent past surgical history.  Current Outpatient Rx  Name  Route  Sig  Dispense  Refill  . amoxicillin (AMOXIL) 500 MG tablet   Oral   Take 1 tablet (500 mg total) by mouth 3 (three) times daily.   30 tablet   0   . citalopram (CELEXA) 10 MG tablet   Oral   Take 10 mg by mouth daily.         . clindamycin (CLEOCIN) 300 MG capsule   Oral   Take 1 capsule (300 mg total) by mouth 4 (four) times daily.   28 capsule   0   . lidocaine (XYLOCAINE) 2 % solution   Mouth/Throat   Use as directed 20 mLs in the mouth or throat as needed (as needed for sore throat).   100 mL   0   . magic mouthwash w/lidocaine SOLN   Oral   Take 5 mLs by mouth 4 (four) times daily.   240 mL   0     Dispense in a 1/1/1/1 ratio. Use lidocaine, diphen ...     Allergies Review of patient's allergies indicates no known allergies.  No family history on  file.  Social History Social History  Substance Use Topics  . Smoking status: Current Every Day Smoker  . Smokeless tobacco: None  . Alcohol Use: No    Review of Systems Constitutional: No fever/chills.Positive syncope positive unresponsiveness. Eyes: No visual changes. ENT: No sore throat. No congestion or rhinorrhea. Cardiovascular: Denies chest pain. Denies palpitations. Respiratory: Denies shortness of breath.  No cough. Gastrointestinal: No abdominal pain.  No nausea, no vomiting.  No diarrhea.  No constipation. Genitourinary: Negative for dysuria. Musculoskeletal: Negative for back pain. Skin: Negative for rash. Neurological: Positive for headaches. No focal numbness, tingling or weakness.  Psychiatric:Denies SI, HI or hallucinations. 10-point ROS otherwise negative.  ____________________________________________   PHYSICAL EXAM:  VITAL SIGNS: ED Triage Vitals  Enc Vitals Group     BP 09/21/15 1149 134/73 mmHg     Pulse Rate 09/21/15 1149 107     Resp 09/21/15 1211 21     Temp 09/21/15 1149 99.1 F (37.3 C)     Temp Source 09/21/15 1149 Oral     SpO2 09/21/15 1149 100 %     Weight 09/21/15 1149 132 lb (59.875 kg)     Height 09/21/15 1149 5\' 6"  (1.676 m)     Head Cir --      Peak Flow --  Pain Score --      Pain Loc --      Pain Edu? --      Excl. in GC? --     Constitutional: Patient is mildly somnolent but awakens easily to verbal interaction. She is in no acute distress.  Eyes: Conjunctivae are normal.  EOMI. No scleral icterus. Head: Atraumatic. Vomitus in the patient's hair. Nose: No congestion/rhinnorhea. Mouth/Throat: Mucous membranes are moist.  Neck: No stridor.  Supple.  No JVD. No meningismus. Cardiovascular: Normal rate, regular rhythm. No murmurs, rubs or gallops.  Respiratory: Normal respiratory effort.  No accessory muscle use or retractions. Lungs CTAB.  No wheezes, rales or ronchi. Gastrointestinal: Soft, nontender and nondistended.   No guarding or rebound.  No peritoneal signs. Musculoskeletal: No LE edema. No ttp in the calves or palpable cords.  Negative Homan's sign. Neurologic: Awakens to verbal stimulus and is productive of her airway at this time.  Speech is clear.  Face and smile are symmetric.  EOMI.  Moves all extremities well. Skin:  Skin is warm, dry and intact. Patient has diffuse circular scabs throughout the face and upper extremities that are consistent with heroin abuse. Psychiatric: Depressed mood and affect.   ____________________________________________   LABS (all labs ordered are listed, but only abnormal results are displayed)  Labs Reviewed  CBC - Abnormal; Notable for the following:    WBC 25.2 (*)    RDW 15.7 (*)    All other components within normal limits  GLUCOSE, CAPILLARY - Abnormal; Notable for the following:    Glucose-Capillary 125 (*)    All other components within normal limits  COMPREHENSIVE METABOLIC PANEL  ETHANOL  SALICYLATE LEVEL  ACETAMINOPHEN LEVEL  URINE DRUG SCREEN, QUALITATIVE ([facility] ONLY)  CBG MONITORING, ED  POC URINE PREG, ED   ____________________________________________  EKG  ED ECG REPORT I, Rockne Menghini, the attending physician, personally viewed and interpreted this ECG.   Date: 09/21/2015  EKG Time: 1149  Rate: 108  Rhythm: sinus tachycardia  Axis: Normal  Intervals:none  ST&T Change: Nonspecific T-wave inversion in V1. No ST elevation.  ____________________________________________  RADIOLOGY  No results found.  ____________________________________________   PROCEDURES  Procedure(s) performed: None  Critical Care performed: No ____________________________________________   INITIAL IMPRESSION / ASSESSMENT AND PLAN / ED COURSE  Pertinent labs & imaging results that were available during my care of the patient were reviewed by me and considered in my medical decision making (see chart for details).  40 y.o. female with  heroin overdose who is somnolent but currently protecting her airway after being given Narcan by the paramedics. She does not have any signs or symptoms of intention to injure herself. We will continue to monitor her for at least 6 hours.  ----------------------------------------- 3:02 PM on 09/21/2015 -----------------------------------------  Since her arrival here, the patient has become more and more awake and is now alert, oriented and does not have any slurred speech. She is able to tolerate food and drink. We will wait and a total of 4 hours from the patient's Narcan to make sure that she does not relapse from her opioid overdose. She also had multiple other findings including amphetamine and cocaine in her urine.  ____________________________________________  FINAL CLINICAL IMPRESSION(S) / ED DIAGNOSES  Final diagnoses:  Heroin overdose, accidental or unintentional, initial encounter      NEW MEDICATIONS STARTED DURING THIS VISIT:  New Prescriptions   No medications on file     Rockne Menghini, MD 09/21/15 1503

## 2015-09-21 NOTE — ED Notes (Signed)
MD at bedside. 

## 2015-09-21 NOTE — ED Notes (Signed)
TTS at pt bedside.  

## 2015-09-21 NOTE — ED Notes (Signed)
Pt up to bathroom.

## 2015-09-21 NOTE — ED Notes (Signed)
Pt states that she uses daily, used X 2 days ago and was in ER, released that night per patient. States that she used the same heroin yesterday, believes that she became unconscious. States she again used today. Pt denies SI. Denies alcohol use.

## 2015-09-21 NOTE — ED Notes (Signed)
Pt up for discharge but this writer is uncomfortable discharging her without someone to pick her up because of her decrease in alertness - notified charge nurse and MD - they both agree that pt should be discharged to a person and placed in their car - pt gave me the name of her "boyfriend" Aurelio BrashJoey - number [phone] - he agreed to come and get her within the hour - pt is sleeping at this time and continues to be able to be aroused with loud verbal stimuli - she is A&O X4 at this time but responses are slow

## 2015-09-21 NOTE — ED Notes (Signed)
Pt given meal tray and drink.

## 2015-09-21 NOTE — Discharge Instructions (Signed)
Please return to the emergency department if you develop sleepiness, difficulty breathing, severe headache, or any other symptoms concerning to you.  Accidental Overdose A drug overdose occurs when a chemical substance (drug or medication) is used in amounts large enough to overcome a person. This may result in severe illness or death. This is a type of poisoning. Accidental overdoses of medications or other substances come from a variety of reasons. When this happens accidentally, it is often because the person taking the substance does not know enough about what they have taken. Drugs which commonly cause overdose deaths are alcohol, psychotropic medications (medications which affect the mind), pain medications, illegal drugs (street drugs) such as cocaine and heroin, and multiple drugs taken at the same time. It may result from careless behavior (such as over-indulging at a party). Other causes of overdose may include multiple drug use, a lapse in memory, or drug use after a period of no drug use.  Sometimes overdosing occurs because a person cannot remember if they have taken their medication.  A common unintentional overdose in young children involves multi-vitamins containing iron. Iron is a part of the hemoglobin molecule in blood. It is used to transport oxygen to living cells. When taken in small amounts, iron allows the body to restock hemoglobin. In large amounts, it causes problems in the body. If this overdose is not treated, it can lead to death. Never take medicines that show signs of tampering or do not seem quite right. Never take medicines in the dark or in poor lighting. Read the label and check each dose of medicine before you take it. When adults are poisoned, it happens most often through carelessness or lack of information. Taking medicines in the dark or taking medicine prescribed for someone else to treat the same type of problem is a dangerous practice. SYMPTOMS  Symptoms of  overdose depend on the medication and amount taken. They can vary from over-activity with stimulant over-dosage, to sleepiness from depressants such as alcohol, narcotics and tranquilizers. Confusion, dizziness, nausea and vomiting may be present. If problems are severe enough coma and death may result. DIAGNOSIS  Diagnosis and management are generally straightforward if the drug is known. Otherwise it is more difficult. At times, certain symptoms and signs exhibited by the patient, or blood tests, can reveal the drug in question.  TREATMENT  In an emergency department, most patients can be treated with supportive measures. Antidotes may be available if there has been an overdose of opioids or benzodiazepines. A rapid improvement will often occur if this is the cause of overdose. At home or away from medical care:  There may be no immediate problems or warning signs in children.  Not everything works well in all cases of poisoning.  Take immediate action. Poisons may act quickly.  If you think someone has swallowed medicine or a household product, and the person is unconscious, having seizures (convulsions), or is not breathing, immediately call for an ambulance. IF a person is conscious and appears to be doing OK but has swallowed a poison:  Do not wait to see what effect the poison will have. Immediately call a poison control center (listed in the white pages of your telephone book under "Poison Control" or inside the front cover with other emergency numbers). Some poison control centers have TTY capability for the deaf. Check with your local center if you or someone in your family requires this service.  Keep the container so you can read the label on  the product for ingredients.  Describe what, when, and how much was taken and the age and condition of the person poisoned. Inform them if the person is vomiting, choking, drowsy, shows a change in color or temperature of skin, is conscious or  unconscious, or is convulsing.  Do not cause vomiting unless instructed by medical personnel. Do not induce vomiting or force liquids into a person who is convulsing, unconscious, or very drowsy. Stay calm and in control.   Activated charcoal also is sometimes used in certain types of poisoning and you may wish to add a supply to your emergency medicines. It is available without a prescription. Call a poison control center before using this medication. PREVENTION  Thousands of children die every year from unintentional poisoning. This may be from household chemicals, poisoning from carbon monoxide in a car, taking their parent's medications, or simply taking a few iron pills or vitamins with iron. Poisoning comes from unexpected sources.  Store medicines out of the sight and reach of children, preferably in a locked cabinet. Do not keep medications in a food cabinet. Always store your medicines in a secure place. Get rid of expired medications.  If you have children living with you or have them as occasional guests, you should have child-resistant caps on your medicine containers. Keep everything out of reach. Child proof your home.  If you are called to the telephone or to answer the door while you are taking a medicine, take the container with you or put the medicine out of the reach of small children.  Do not take your medication in front of children. Do not tell your child how good a medication is and how good it is for them. They may get the idea it is more of a treat.  If you are an adult and have accidentally taken an overdose, you need to consider how this happened and what can be done to prevent it from happening again. If this was from a street drug or alcohol, determine if there is a problem that needs addressing. If you are not sure a problems exists, it is easy to talk to a professional and ask them if they think you have a problem. It is better to handle this problem in this way before  it happens again and has a much worse consequence.   This information is not intended to replace advice given to you by your health care provider. Make sure you discuss any questions you have with your health care provider.   Document Released: 07/19/2004 Document Revised: 05/26/2014 Document Reviewed: 10/23/2014 Elsevier Interactive Patient Education 2016 [employer]  Narcotic Overdose A narcotic overdose is the misuse or overuse of a narcotic drug. A narcotic overdose can make you pass out and stop breathing. If you are not treated right away, this can cause permanent brain damage or stop your heart. Medicine may be given to reverse the effects of an overdose. If so, this medicine may bring on withdrawal symptoms. The symptoms may be abdominal cramps, throwing up (vomiting), sweating, chills, and nervousness. Injecting narcotics can cause more problems than just an overdose. AIDS, hepatitis, and other very serious infections are transmitted by sharing needles and syringes. If you decide to quit using, there are medicines which can help you through the withdrawal period. Trying to quit all at once on your own can be uncomfortable, but not life-threatening. Call your caregiver, Narcotics Anonymous, or any drug and alcohol treatment program for further help.  This information is not intended to replace advice given to you by your health care provider. Make sure you discuss any questions you have with your health care provider.   Document Released: 06/12/2004 Document Revised: 05/26/2014 Document Reviewed: 10/25/2014 Elsevier Interactive Patient Education [employer]

## 2015-09-21 NOTE — ED Notes (Addendum)
Pt is hard to arouse without loud verbal stimuli and shaking - when aroused she does answer questions appropriately but then falls back to sleep in middle of conversation - charge nurse notified -

## 2015-09-21 NOTE — ED Notes (Signed)
Left message for Amber Gilmore to return call - attempting to see if he is still coming so that pt can be discharged

## 2017-08-17 ENCOUNTER — Other Ambulatory Visit: Payer: Self-pay

## 2017-08-17 ENCOUNTER — Emergency Department
Admission: EM | Admit: 2017-08-17 | Discharge: 2017-08-17 | Disposition: A | Discharge status: Home / Self Care | Payer: Self-pay | Attending: Emergency Medicine | Admitting: Emergency Medicine

## 2017-08-17 ENCOUNTER — Encounter: Payer: Self-pay | Admitting: Emergency Medicine

## 2017-08-17 DIAGNOSIS — F1190 Opioid use, unspecified, uncomplicated: Secondary | ICD-10-CM | POA: Insufficient documentation

## 2017-08-17 DIAGNOSIS — G4700 Insomnia, unspecified: Secondary | ICD-10-CM | POA: Insufficient documentation

## 2017-08-17 DIAGNOSIS — Z5321 Procedure and treatment not carried out due to patient leaving prior to being seen by health care provider: Secondary | ICD-10-CM | POA: Insufficient documentation

## 2017-08-17 DIAGNOSIS — R55 Syncope and collapse: Secondary | ICD-10-CM | POA: Insufficient documentation

## 2017-08-17 LAB — URINE DRUG SCREEN, QUALITATIVE (ARMC ONLY)
AMPHETAMINES, UR SCREEN: NOT DETECTED
Barbiturates, Ur Screen: NOT DETECTED
Benzodiazepine, Ur Scrn: NOT DETECTED
CANNABINOID 50 NG, UR ~~LOC~~: NOT DETECTED
Cocaine Metabolite,Ur ~~LOC~~: POSITIVE — AB
MDMA (ECSTASY) UR SCREEN: NOT DETECTED
Methadone Scn, Ur: NOT DETECTED
Opiate, Ur Screen: POSITIVE — AB
Phencyclidine (PCP) Ur S: NOT DETECTED
TRICYCLIC, UR SCREEN: NOT DETECTED

## 2017-08-17 LAB — COMPREHENSIVE METABOLIC PANEL
ALK PHOS: 76 U/L (ref 38–126)
ALT: 12 U/L — ABNORMAL LOW (ref 14–54)
ANION GAP: 8 (ref 5–15)
AST: 20 U/L (ref 15–41)
Albumin: 3.9 g/dL (ref 3.5–5.0)
BILIRUBIN TOTAL: 0.3 mg/dL (ref 0.3–1.2)
BUN: 8 mg/dL (ref 6–20)
CALCIUM: 9.5 mg/dL (ref 8.9–10.3)
CO2: 28 mmol/L (ref 22–32)
Chloride: 103 mmol/L (ref 101–111)
Creatinine, Ser: 0.73 mg/dL (ref 0.44–1.00)
GFR calc Af Amer: >60 mL/min (ref >60)
GFR calc non Af Amer: >60 mL/min (ref >60)
Glucose, Bld: 103 mg/dL — ABNORMAL HIGH (ref 65–99)
Potassium: 4.7 mmol/L (ref 3.5–5.1)
Sodium: 139 mmol/L (ref 135–145)
TOTAL PROTEIN: 8.3 g/dL — AB (ref 6.5–8.1)

## 2017-08-17 LAB — CBC
HCT: 43.4 % (ref 35.0–47.0)
Hemoglobin: 13.9 g/dL (ref 12.0–16.0)
MCH: 25.8 pg — ABNORMAL LOW (ref 26.0–34.0)
MCHC: 32.1 g/dL (ref 32.0–36.0)
MCV: 80.3 fL (ref 80.0–100.0)
PLATELETS: 377 10*3/uL (ref 150–440)
RBC: 5.41 MIL/uL — AB (ref 3.80–5.20)
RDW: 16.3 % — ABNORMAL HIGH (ref 11.5–14.5)
WBC: 7.1 10*3/uL (ref 3.6–11.0)

## 2017-08-17 LAB — ETHANOL

## 2017-08-17 LAB — POCT PREGNANCY, URINE: PREG TEST UR: NEGATIVE
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
## 2017-08-17 NOTE — ED Notes (Signed)
Called patient in waiting room.  No answer.

## 2017-08-17 NOTE — ED Notes (Signed)
Patietn called in waiting room.  No Answer.

## 2017-08-17 NOTE — ED Triage Notes (Signed)
First Nurse Note:  Arrives stating that she last used Heroin 2 days ago and just had a syncopal episode in the car, just PTA.  Denies SI/ HI.  States just wants to be checked out.  AAOx3.  Skin warm and dry.  NAD.

## 2017-08-17 NOTE — ED Notes (Signed)
Called in waiting room.  No answer. 

## 2017-09-18 ENCOUNTER — Encounter: Payer: Self-pay | Admitting: *Deleted

## 2017-09-18 ENCOUNTER — Emergency Department
Admission: EM | Admit: 2017-09-18 | Discharge: 2017-09-18 | Discharge status: Against Medical Advice | Payer: Self-pay | Attending: Emergency Medicine | Admitting: Emergency Medicine

## 2017-09-18 ENCOUNTER — Other Ambulatory Visit: Payer: Self-pay

## 2017-09-18 DIAGNOSIS — Z79899 Other long term (current) drug therapy: Secondary | ICD-10-CM | POA: Insufficient documentation

## 2017-09-18 DIAGNOSIS — F17200 Nicotine dependence, unspecified, uncomplicated: Secondary | ICD-10-CM | POA: Insufficient documentation

## 2017-09-18 DIAGNOSIS — T401X1A Poisoning by heroin, accidental (unintentional), initial encounter: Secondary | ICD-10-CM | POA: Insufficient documentation

## 2017-09-18 HISTORY — DX: Endocarditis, valve unspecified: I38

## 2017-09-18 HISTORY — DX: Unspecified viral hepatitis C without hepatic coma: B19.20

## 2017-09-18 NOTE — Discharge Instructions (Signed)
Please seek medical attention for any high fevers, chest pain, shortness of breath, change in behavior, persistent vomiting, bloody stool or any other new or concerning symptoms.  

## 2017-09-18 NOTE — ED Triage Notes (Signed)
Pt found in car, administered narcan by police. Pt found to have needles in car and admitted to heroin use. Pt has hx of heroin use and Hep C. Pt was cooperative during initial triage. Pt is highly irritable and has difficulty focusing. Pt pulled PIV and monitors off, states that she does not want to be treated or stay. Dr. Derrill Kay at bedside.
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
## 2019-12-26 ENCOUNTER — Encounter (HOSPITAL_COMMUNITY): Payer: Self-pay | Admitting: *Deleted

## 2019-12-26 ENCOUNTER — Other Ambulatory Visit: Payer: Self-pay

## 2019-12-26 ENCOUNTER — Emergency Department (HOSPITAL_COMMUNITY)
Admission: EM | Admit: 2019-12-26 | Discharge: 2019-12-26 | Disposition: A | Discharge status: Home / Self Care | Payer: Self-pay | Attending: Emergency Medicine | Admitting: Emergency Medicine

## 2019-12-26 DIAGNOSIS — N760 Acute vaginitis: Secondary | ICD-10-CM | POA: Insufficient documentation

## 2019-12-26 DIAGNOSIS — Z5321 Procedure and treatment not carried out due to patient leaving prior to being seen by health care provider: Secondary | ICD-10-CM | POA: Insufficient documentation

## 2019-12-26 NOTE — ED Triage Notes (Signed)
Abscess in vaginal area for several days
